# Patient Record
Sex: Female | Born: 1975 | Race: White | Hispanic: No | Marital: Married | Smoking: Never smoker
Health system: Southern US, Community
[De-identification: ages and names within clinical notes are randomized; demographics above are authoritative.]

## PROBLEM LIST (undated history)

## (undated) DIAGNOSIS — G4730 Sleep apnea, unspecified: Secondary | ICD-10-CM

## (undated) DIAGNOSIS — R609 Edema, unspecified: Secondary | ICD-10-CM

## (undated) DIAGNOSIS — I10 Essential (primary) hypertension: Secondary | ICD-10-CM

## (undated) DIAGNOSIS — D869 Sarcoidosis, unspecified: Secondary | ICD-10-CM

## (undated) DIAGNOSIS — J45909 Unspecified asthma, uncomplicated: Secondary | ICD-10-CM

## (undated) DIAGNOSIS — M7989 Other specified soft tissue disorders: Secondary | ICD-10-CM

## (undated) DIAGNOSIS — Z87442 Personal history of urinary calculi: Secondary | ICD-10-CM

## (undated) DIAGNOSIS — M797 Fibromyalgia: Secondary | ICD-10-CM

## (undated) DIAGNOSIS — N946 Dysmenorrhea, unspecified: Secondary | ICD-10-CM

## (undated) DIAGNOSIS — E559 Vitamin D deficiency, unspecified: Secondary | ICD-10-CM

## (undated) DIAGNOSIS — N83209 Unspecified ovarian cyst, unspecified side: Secondary | ICD-10-CM

## (undated) DIAGNOSIS — R413 Other amnesia: Secondary | ICD-10-CM

## (undated) DIAGNOSIS — R599 Enlarged lymph nodes, unspecified: Secondary | ICD-10-CM

## (undated) DIAGNOSIS — M5136 Other intervertebral disc degeneration, lumbar region: Secondary | ICD-10-CM

## (undated) DIAGNOSIS — G4733 Obstructive sleep apnea (adult) (pediatric): Secondary | ICD-10-CM

## (undated) DIAGNOSIS — S82891A Other fracture of right lower leg, initial encounter for closed fracture: Secondary | ICD-10-CM

## (undated) DIAGNOSIS — G479 Sleep disorder, unspecified: Secondary | ICD-10-CM

## (undated) DIAGNOSIS — J309 Allergic rhinitis, unspecified: Secondary | ICD-10-CM

## (undated) DIAGNOSIS — S2222XA Fracture of body of sternum, initial encounter for closed fracture: Secondary | ICD-10-CM

## (undated) DIAGNOSIS — H6090 Unspecified otitis externa, unspecified ear: Secondary | ICD-10-CM

## (undated) DIAGNOSIS — Z9989 Dependence on other enabling machines and devices: Secondary | ICD-10-CM

## (undated) DIAGNOSIS — R945 Abnormal results of liver function studies: Secondary | ICD-10-CM

## (undated) DIAGNOSIS — R5383 Other fatigue: Secondary | ICD-10-CM

## (undated) DIAGNOSIS — F988 Other specified behavioral and emotional disorders with onset usually occurring in childhood and adolescence: Secondary | ICD-10-CM

## (undated) DIAGNOSIS — L309 Dermatitis, unspecified: Secondary | ICD-10-CM

## (undated) HISTORY — DX: Other specified behavioral and emotional disorders with onset usually occurring in childhood and adolescence: F98.8

## (undated) HISTORY — DX: Other amnesia: R41.3

## (undated) HISTORY — DX: Unspecified ovarian cyst, unspecified side: N83.209

## (undated) HISTORY — DX: Dependence on other enabling machines and devices: Z99.89

## (undated) HISTORY — DX: Other intervertebral disc degeneration, lumbar region: M51.36

## (undated) HISTORY — DX: Obstructive sleep apnea (adult) (pediatric): G47.33

## (undated) HISTORY — DX: Abnormal results of liver function studies: R94.5

## (undated) HISTORY — DX: Enlarged lymph nodes, unspecified: R59.9

## (undated) HISTORY — DX: Sarcoidosis, unspecified: D86.9

## (undated) HISTORY — DX: Dysmenorrhea, unspecified: N94.6

## (undated) HISTORY — DX: Sleep disorder, unspecified: G47.9

## (undated) HISTORY — DX: Allergic rhinitis, unspecified: J30.9

## (undated) HISTORY — DX: Vitamin D deficiency, unspecified: E55.9

## (undated) HISTORY — PX: UPPER GI ENDOSCOPY: SHX6162

---
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
## 2016-04-09 HISTORY — PX: OTHER SURGICAL HISTORY: SHX169

## 2018-08-08 DIAGNOSIS — S82891A Other fracture of right lower leg, initial encounter for closed fracture: Secondary | ICD-10-CM

## 2018-08-08 DIAGNOSIS — S2222XA Fracture of body of sternum, initial encounter for closed fracture: Secondary | ICD-10-CM

## 2018-08-08 DIAGNOSIS — L309 Dermatitis, unspecified: Secondary | ICD-10-CM

## 2018-08-08 HISTORY — DX: Other fracture of right lower leg, initial encounter for closed fracture: S82.891A

## 2018-08-08 HISTORY — DX: Fracture of body of sternum, initial encounter for closed fracture: S22.22XA

## 2018-08-08 HISTORY — DX: Dermatitis, unspecified: L30.9

## 2018-09-01 ENCOUNTER — Emergency Department (HOSPITAL_COMMUNITY): Payer: BC Managed Care – PPO

## 2018-09-01 ENCOUNTER — Emergency Department (HOSPITAL_COMMUNITY)
Admission: EM | Admit: 2018-09-01 | Discharge: 2018-09-02 | Disposition: A | Discharge status: Home / Self Care | Payer: BC Managed Care – PPO | Attending: Emergency Medicine | Admitting: Emergency Medicine

## 2018-09-01 ENCOUNTER — Encounter (HOSPITAL_COMMUNITY): Payer: Self-pay

## 2018-09-01 ENCOUNTER — Other Ambulatory Visit: Payer: Self-pay

## 2018-09-01 DIAGNOSIS — R51 Headache: Secondary | ICD-10-CM | POA: Insufficient documentation

## 2018-09-01 DIAGNOSIS — Y939 Activity, unspecified: Secondary | ICD-10-CM | POA: Diagnosis not present

## 2018-09-01 DIAGNOSIS — S2221XA Fracture of manubrium, initial encounter for closed fracture: Secondary | ICD-10-CM | POA: Diagnosis not present

## 2018-09-01 DIAGNOSIS — Y92410 Unspecified street and highway as the place of occurrence of the external cause: Secondary | ICD-10-CM | POA: Insufficient documentation

## 2018-09-01 DIAGNOSIS — S301XXA Contusion of abdominal wall, initial encounter: Secondary | ICD-10-CM | POA: Diagnosis not present

## 2018-09-01 DIAGNOSIS — S20219A Contusion of unspecified front wall of thorax, initial encounter: Secondary | ICD-10-CM | POA: Diagnosis not present

## 2018-09-01 DIAGNOSIS — S93334A Other dislocation of right foot, initial encounter: Secondary | ICD-10-CM | POA: Insufficient documentation

## 2018-09-01 LAB — CBC
HCT: 42.7 % (ref 36.0–46.0)
Hemoglobin: 14.1 g/dL (ref 12.0–15.0)
MCH: 31 pg (ref 26.0–34.0)
MCHC: 33 g/dL (ref 30.0–36.0)
MCV: 93.8 fL (ref 80.0–100.0)
Platelets: 255 10*3/uL (ref 150–400)
RBC: 4.55 MIL/uL (ref 3.87–5.11)
RDW: 12.6 % (ref 11.5–15.5)
WBC: 23.4 10*3/uL — ABNORMAL HIGH (ref 4.0–10.5)
nRBC: 0 % (ref 0.0–0.2)

## 2018-09-01 LAB — COMPREHENSIVE METABOLIC PANEL
ALT: 41 U/L (ref 0–44)
AST: 61 U/L — ABNORMAL HIGH (ref 15–41)
Albumin: 3.9 g/dL (ref 3.5–5.0)
Alkaline Phosphatase: 58 U/L (ref 38–126)
Anion gap: 11 (ref 5–15)
BUN: 17 mg/dL (ref 6–20)
CO2: 27 mmol/L (ref 22–32)
Calcium: 9.4 mg/dL (ref 8.9–10.3)
Chloride: 98 mmol/L (ref 98–111)
Creatinine, Ser: 0.93 mg/dL (ref 0.44–1.00)
GFR calc Af Amer: >60 mL/min (ref >60)
GFR calc non Af Amer: >60 mL/min (ref >60)
Glucose, Bld: 146 mg/dL — ABNORMAL HIGH (ref 70–99)
Potassium: 3.4 mmol/L — ABNORMAL LOW (ref 3.5–5.1)
Sodium: 136 mmol/L (ref 135–145)
Total Bilirubin: 0.6 mg/dL (ref 0.3–1.2)
Total Protein: 7.1 g/dL (ref 6.5–8.1)

## 2018-09-01 LAB — I-STAT BETA HCG BLOOD, ED (MC, WL, AP ONLY): I-stat hCG, quantitative: <5 m[IU]/mL (ref <5)

## 2018-09-01 LAB — PROTIME-INR
INR: 1.1 (ref 0.8–1.2)
Prothrombin Time: 13.9 seconds (ref 11.4–15.2)

## 2018-09-01 MED ORDER — HYDROMORPHONE HCL 1 MG/ML IJ SOLN
1.0000 mg | Freq: Once | INTRAMUSCULAR | Status: AC
  Administered 2018-09-01: 1 mg via INTRAVENOUS
  Filled 2018-09-01: qty 1

## 2018-09-01 MED ORDER — FENTANYL CITRATE (PF) 100 MCG/2ML IJ SOLN: 50.0000 ug | Freq: Once | INTRAMUSCULAR | Status: DC

## 2018-09-01 MED ORDER — IOHEXOL 300 MG/ML  SOLN
100.0000 mL | Freq: Once | INTRAMUSCULAR | Status: AC | PRN
  Administered 2018-09-01: 100 mL via INTRAVENOUS

## 2018-09-01 MED ORDER — LIDOCAINE HCL (PF) 1 % IJ SOLN
10.0000 mL | Freq: Once | INTRAMUSCULAR | Status: AC
  Administered 2018-09-02: 10 mL
  Filled 2018-09-01: qty 10

## 2018-09-01 MED ORDER — ONDANSETRON HCL 4 MG/2ML IJ SOLN
4.0000 mg | Freq: Once | INTRAMUSCULAR | Status: AC
  Administered 2018-09-01: 4 mg via INTRAVENOUS
  Filled 2018-09-01: qty 2

## 2018-09-01 MED ORDER — FENTANYL CITRATE (PF) 100 MCG/2ML IJ SOLN
50.0000 ug | Freq: Once | INTRAMUSCULAR | Status: AC
  Administered 2018-09-01: 50 ug via INTRAVENOUS
  Filled 2018-09-01: qty 2

## 2018-09-01 MED ORDER — HYDROMORPHONE HCL 1 MG/ML IJ SOLN
1.0000 mg | Freq: Once | INTRAMUSCULAR | Status: AC
  Administered 2018-09-01: 23:00:00 1 mg via INTRAVENOUS
  Filled 2018-09-01: qty 1
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
## 2018-09-01 NOTE — ED Notes (Signed)
PT transported to imaging  
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
## 2018-09-02 ENCOUNTER — Emergency Department (HOSPITAL_COMMUNITY): Payer: BC Managed Care – PPO

## 2018-09-02 DIAGNOSIS — S2221XA Fracture of manubrium, initial encounter for closed fracture: Secondary | ICD-10-CM | POA: Diagnosis not present

## 2018-09-02 MED ORDER — HYDROCODONE-ACETAMINOPHEN 5-325 MG PO TABS: 1.0000 | ORAL_TABLET | ORAL | 0 refills | Status: DC | PRN
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
## 2018-09-02 NOTE — ED Provider Notes (Signed)
Reduction of dislocation Date/Time: 09/02/2018 12:24 AM Performed by: Dicky Doe, MD Authorized by: Glynn Octave, MD  Consent: Verbal consent obtained. Consent given by: patient Patient consent: the patient's understanding of the procedure matches consent given Patient identity confirmed: verbally with patient and arm band Local anesthesia used: yes Anesthesia: local infiltration and digital block  Anesthesia: Local anesthesia used: yes Local Anesthetic: lidocaine 1% without epinephrine Anesthetic total: 4 mL  Sedation: Patient sedated: no  Patient tolerance: Patient tolerated the procedure well with no immediate complications       Dicky Doe, MD 09/02/18 Waunita Schooner    Glynn Octave, MD 09/02/18 [phone]

## 2018-09-02 NOTE — Discharge Instructions (Addendum)
Your CT scans are negative for any significant injury but show a good amount of bruising and a crack in your breastbone.  Will be very sore all over for the next several days.  You should not put weight on your right foot and follow-up with Dr. Eulah Pont regarding her dislocation.  Take the pain medication as prescribed.  Follow-up with your primary doctor regarding the inflamed lymph nodes seen on your CT scans.  Return to the ED develop chest pain, shortness of breath or other concerns.

## 2018-09-08 ENCOUNTER — Other Ambulatory Visit: Payer: Self-pay | Admitting: Orthopedic Surgery

## 2018-09-08 DIAGNOSIS — M79672 Pain in left foot: Secondary | ICD-10-CM

## 2018-09-11 ENCOUNTER — Ambulatory Visit
Admission: RE | Admit: 2018-09-11 | Discharge: 2018-09-11 | Disposition: A | Discharge status: Home / Self Care | Payer: BC Managed Care – PPO | Source: Ambulatory Visit | Attending: Orthopedic Surgery | Admitting: Orthopedic Surgery

## 2018-09-11 ENCOUNTER — Other Ambulatory Visit: Payer: Self-pay | Admitting: Orthopedic Surgery

## 2018-09-11 ENCOUNTER — Other Ambulatory Visit: Payer: Self-pay

## 2018-09-11 DIAGNOSIS — M79671 Pain in right foot: Secondary | ICD-10-CM

## 2018-09-15 ENCOUNTER — Other Ambulatory Visit (HOSPITAL_COMMUNITY)
Admission: RE | Admit: 2018-09-15 | Discharge: 2018-09-15 | Disposition: A | Discharge status: Home / Self Care | Payer: BC Managed Care – PPO | Source: Ambulatory Visit | Attending: Orthopaedic Surgery | Admitting: Orthopaedic Surgery

## 2018-09-15 ENCOUNTER — Other Ambulatory Visit: Payer: Self-pay | Admitting: Orthopaedic Surgery

## 2018-09-15 ENCOUNTER — Encounter (HOSPITAL_BASED_OUTPATIENT_CLINIC_OR_DEPARTMENT_OTHER): Payer: Self-pay | Admitting: *Deleted

## 2018-09-15 ENCOUNTER — Other Ambulatory Visit: Payer: Self-pay

## 2018-09-15 DIAGNOSIS — Z1159 Encounter for screening for other viral diseases: Secondary | ICD-10-CM | POA: Diagnosis present

## 2018-09-15 LAB — SARS CORONAVIRUS 2 BY RT PCR (HOSPITAL ORDER, PERFORMED IN ~~LOC~~ HOSPITAL LAB): SARS Coronavirus 2: NEGATIVE

## 2018-09-16 ENCOUNTER — Ambulatory Visit (HOSPITAL_BASED_OUTPATIENT_CLINIC_OR_DEPARTMENT_OTHER): Payer: BC Managed Care – PPO | Admitting: Certified Registered"

## 2018-09-16 ENCOUNTER — Ambulatory Visit (HOSPITAL_COMMUNITY): Payer: BC Managed Care – PPO

## 2018-09-16 ENCOUNTER — Encounter (HOSPITAL_BASED_OUTPATIENT_CLINIC_OR_DEPARTMENT_OTHER)
Admission: RE | Disposition: A | Discharge status: Home / Self Care | Payer: Self-pay | Source: Home / Self Care | Attending: Orthopaedic Surgery

## 2018-09-16 ENCOUNTER — Ambulatory Visit (HOSPITAL_BASED_OUTPATIENT_CLINIC_OR_DEPARTMENT_OTHER)
Admission: RE | Admit: 2018-09-16 | Discharge: 2018-09-16 | Disposition: A | Discharge status: Home / Self Care | Payer: BC Managed Care – PPO | Attending: Orthopaedic Surgery | Admitting: Orthopaedic Surgery

## 2018-09-16 ENCOUNTER — Encounter (HOSPITAL_BASED_OUTPATIENT_CLINIC_OR_DEPARTMENT_OTHER): Payer: Self-pay | Admitting: Certified Registered"

## 2018-09-16 DIAGNOSIS — Z419 Encounter for procedure for purposes other than remedying health state, unspecified: Secondary | ICD-10-CM

## 2018-09-16 DIAGNOSIS — S93324A Dislocation of tarsometatarsal joint of right foot, initial encounter: Secondary | ICD-10-CM | POA: Diagnosis not present

## 2018-09-16 DIAGNOSIS — S92221A Displaced fracture of lateral cuneiform of right foot, initial encounter for closed fracture: Secondary | ICD-10-CM | POA: Diagnosis not present

## 2018-09-16 DIAGNOSIS — S92211A Displaced fracture of cuboid bone of right foot, initial encounter for closed fracture: Secondary | ICD-10-CM | POA: Insufficient documentation

## 2018-09-16 DIAGNOSIS — S9421XA Injury of deep peroneal nerve at ankle and foot level, right leg, initial encounter: Secondary | ICD-10-CM | POA: Diagnosis not present

## 2018-09-16 DIAGNOSIS — S93311A Subluxation of tarsal joint of right foot, initial encounter: Secondary | ICD-10-CM | POA: Insufficient documentation

## 2018-09-16 DIAGNOSIS — S92341A Displaced fracture of fourth metatarsal bone, right foot, initial encounter for closed fracture: Secondary | ICD-10-CM | POA: Diagnosis not present

## 2018-09-16 DIAGNOSIS — S95091A Other specified injury of dorsal artery of right foot, initial encounter: Secondary | ICD-10-CM | POA: Insufficient documentation

## 2018-09-16 DIAGNOSIS — S92311A Displaced fracture of first metatarsal bone, right foot, initial encounter for closed fracture: Secondary | ICD-10-CM | POA: Insufficient documentation

## 2018-09-16 DIAGNOSIS — J45909 Unspecified asthma, uncomplicated: Secondary | ICD-10-CM | POA: Insufficient documentation

## 2018-09-16 DIAGNOSIS — Z79899 Other long term (current) drug therapy: Secondary | ICD-10-CM | POA: Diagnosis not present

## 2018-09-16 DIAGNOSIS — Z6841 Body Mass Index (BMI) 40.0 and over, adult: Secondary | ICD-10-CM | POA: Diagnosis not present

## 2018-09-16 DIAGNOSIS — S92241A Displaced fracture of medial cuneiform of right foot, initial encounter for closed fracture: Secondary | ICD-10-CM | POA: Insufficient documentation

## 2018-09-16 DIAGNOSIS — G4730 Sleep apnea, unspecified: Secondary | ICD-10-CM | POA: Diagnosis not present

## 2018-09-16 DIAGNOSIS — S92321A Displaced fracture of second metatarsal bone, right foot, initial encounter for closed fracture: Secondary | ICD-10-CM | POA: Insufficient documentation

## 2018-09-16 HISTORY — DX: Sleep apnea, unspecified: G47.30

## 2018-09-16 HISTORY — DX: Personal history of urinary calculi: Z87.442

## 2018-09-16 HISTORY — DX: Unspecified asthma, uncomplicated: J45.909

## 2018-09-16 HISTORY — PX: OPEN REDUCTION INTERNAL FIXATION (ORIF) FOOT LISFRANC FRACTURE: SHX5990

## 2018-09-16 SURGERY — OPEN REDUCTION INTERNAL FIXATION (ORIF) FOOT LISFRANC FRACTURE
Anesthesia: General | Site: Foot | Laterality: Right

## 2018-09-16 MED ORDER — ROPIVACAINE HCL 5 MG/ML IJ SOLN
INTRAMUSCULAR | Status: DC | PRN
  Administered 2018-09-16: 20 mL via PERINEURAL
  Administered 2018-09-16: 30 mL via PERINEURAL

## 2018-09-16 MED ORDER — OXYCODONE HCL 5 MG PO TABS
5.0000 mg | ORAL_TABLET | Freq: Once | ORAL | Status: AC | PRN
  Administered 2018-09-16: 5 mg via ORAL

## 2018-09-16 MED ORDER — FENTANYL CITRATE (PF) 250 MCG/5ML IJ SOLN
INTRAMUSCULAR | Status: DC | PRN
  Administered 2018-09-16: 50 ug via INTRAVENOUS
  Administered 2018-09-16 (×2): 25 ug via INTRAVENOUS

## 2018-09-16 MED ORDER — KETOROLAC TROMETHAMINE 0.5 % OP SOLN
OPHTHALMIC | Status: AC
  Filled 2018-09-16: qty 5

## 2018-09-16 MED ORDER — MIDAZOLAM HCL 2 MG/2ML IJ SOLN
1.0000 mg | INTRAMUSCULAR | Status: DC | PRN
  Administered 2018-09-16: 2 mg via INTRAVENOUS

## 2018-09-16 MED ORDER — PROPOFOL 10 MG/ML IV BOLUS
INTRAVENOUS | Status: AC
  Filled 2018-09-16: qty 20

## 2018-09-16 MED ORDER — LIDOCAINE 2% (20 MG/ML) 5 ML SYRINGE
INTRAMUSCULAR | Status: DC | PRN
  Administered 2018-09-16: 100 mg via INTRAVENOUS

## 2018-09-16 MED ORDER — SCOPOLAMINE 1 MG/3DAYS TD PT72: 1.0000 | MEDICATED_PATCH | Freq: Once | TRANSDERMAL | Status: DC | PRN

## 2018-09-16 MED ORDER — PROPOFOL 10 MG/ML IV BOLUS
INTRAVENOUS | Status: DC | PRN
  Administered 2018-09-16: 200 mg via INTRAVENOUS

## 2018-09-16 MED ORDER — FENTANYL CITRATE (PF) 100 MCG/2ML IJ SOLN
INTRAMUSCULAR | Status: AC
  Filled 2018-09-16: qty 2

## 2018-09-16 MED ORDER — BSS IO SOLN: 15.0000 mL | Freq: Once | INTRAOCULAR | Status: DC

## 2018-09-16 MED ORDER — MIDAZOLAM HCL 2 MG/2ML IJ SOLN
INTRAMUSCULAR | Status: AC
  Filled 2018-09-16: qty 2

## 2018-09-16 MED ORDER — LACTATED RINGERS IV SOLN
INTRAVENOUS | Status: DC
  Administered 2018-09-16 (×2): via INTRAVENOUS

## 2018-09-16 MED ORDER — POVIDONE-IODINE 10 % EX SWAB: 2.0000 "application " | Freq: Once | CUTANEOUS | Status: DC

## 2018-09-16 MED ORDER — SUCCINYLCHOLINE CHLORIDE 200 MG/10ML IV SOSY
PREFILLED_SYRINGE | INTRAVENOUS | Status: DC | PRN
  Administered 2018-09-16: 120 mg via INTRAVENOUS

## 2018-09-16 MED ORDER — ONDANSETRON HCL 4 MG/2ML IJ SOLN
INTRAMUSCULAR | Status: AC
  Filled 2018-09-16: qty 4

## 2018-09-16 MED ORDER — OXYCODONE HCL 5 MG PO TABS: 5.0000 mg | ORAL_TABLET | Freq: Four times a day (QID) | ORAL | 0 refills | Status: AC | PRN

## 2018-09-16 MED ORDER — ONDANSETRON HCL 4 MG/2ML IJ SOLN
INTRAMUSCULAR | Status: DC | PRN
  Administered 2018-09-16: 4 mg via INTRAVENOUS

## 2018-09-16 MED ORDER — ROCURONIUM BROMIDE 10 MG/ML (PF) SYRINGE
PREFILLED_SYRINGE | INTRAVENOUS | Status: DC | PRN
  Administered 2018-09-16: 50 mg via INTRAVENOUS

## 2018-09-16 MED ORDER — KETOROLAC TROMETHAMINE 0.5 % OP SOLN: 1.0000 [drp] | Freq: Three times a day (TID) | OPHTHALMIC | Status: DC | PRN

## 2018-09-16 MED ORDER — OXYCODONE HCL 5 MG PO TABS
ORAL_TABLET | ORAL | Status: AC
  Filled 2018-09-16: qty 1

## 2018-09-16 MED ORDER — CEFAZOLIN SODIUM-DEXTROSE 2-4 GM/100ML-% IV SOLN
INTRAVENOUS | Status: AC
  Filled 2018-09-16: qty 100

## 2018-09-16 MED ORDER — OXYCODONE HCL 5 MG/5ML PO SOLN: 5.0000 mg | Freq: Once | ORAL | Status: AC | PRN

## 2018-09-16 MED ORDER — FENTANYL CITRATE (PF) 100 MCG/2ML IJ SOLN: 25.0000 ug | INTRAMUSCULAR | Status: DC | PRN

## 2018-09-16 MED ORDER — FENTANYL CITRATE (PF) 100 MCG/2ML IJ SOLN
50.0000 ug | INTRAMUSCULAR | Status: DC | PRN
  Administered 2018-09-16: 100 ug via INTRAVENOUS

## 2018-09-16 MED ORDER — ARTIFICIAL TEARS OPHTHALMIC OINT
TOPICAL_OINTMENT | OPHTHALMIC | Status: AC
  Filled 2018-09-16: qty 3.5

## 2018-09-16 MED ORDER — CEFAZOLIN SODIUM-DEXTROSE 2-4 GM/100ML-% IV SOLN
2.0000 g | INTRAVENOUS | Status: AC
  Administered 2018-09-16: 2 g via INTRAVENOUS

## 2018-09-16 MED ORDER — DEXAMETHASONE SODIUM PHOSPHATE 10 MG/ML IJ SOLN
INTRAMUSCULAR | Status: DC | PRN
  Administered 2018-09-16: 5 mg via INTRAVENOUS

## 2018-09-16 MED ORDER — ONDANSETRON HCL 4 MG/2ML IJ SOLN: 4.0000 mg | Freq: Once | INTRAMUSCULAR | Status: DC | PRN

## 2018-09-16 MED ORDER — CLONIDINE HCL (ANALGESIA) 100 MCG/ML EP SOLN
EPIDURAL | Status: DC | PRN
  Administered 2018-09-16: 100 ug

## 2018-09-16 SURGICAL SUPPLY — 81 items
BANDAGE ACE 4X5 VEL STRL LF (GAUZE/BANDAGES/DRESSINGS) IMPLANT
BANDAGE ACE 6X5 VEL STRL LF (GAUZE/BANDAGES/DRESSINGS) ×4 IMPLANT
BANDAGE ESMARK 6X9 LF (GAUZE/BANDAGES/DRESSINGS) IMPLANT
BENZOIN TINCTURE PRP APPL 2/3 (GAUZE/BANDAGES/DRESSINGS) IMPLANT
BIT DRILL 2.5 CANN ARTHO (BIT) ×2 IMPLANT
BIT DRILL 3.2 CANN STRGHT (BIT) ×2 IMPLANT
BLADE SURG 15 STRL LF DISP TIS (BLADE) ×4 IMPLANT
BLADE SURG 15 STRL SS (BLADE) ×4
BNDG COHESIVE 4X5 TAN STRL (GAUZE/BANDAGES/DRESSINGS) ×2 IMPLANT
BNDG ESMARK 4X9 LF (GAUZE/BANDAGES/DRESSINGS) IMPLANT
BNDG ESMARK 6X9 LF (GAUZE/BANDAGES/DRESSINGS)
CHLORAPREP W/TINT 26 (MISCELLANEOUS) ×2 IMPLANT
COVER BACK TABLE REUSABLE LG (DRAPES) ×4 IMPLANT
COVER MAYO STAND STRL (DRAPES) ×2 IMPLANT
COVER WAND RF STERILE (DRAPES) IMPLANT
CUFF TOURN SGL QUICK 34 (TOURNIQUET CUFF)
CUFF TOURN SGL QUICK 42 (TOURNIQUET CUFF) ×2 IMPLANT
CUFF TRNQT CYL 34X4.125X (TOURNIQUET CUFF) IMPLANT
DECANTER SPIKE VIAL GLASS SM (MISCELLANEOUS) IMPLANT
DRAPE C-ARM 42X72 X-RAY (DRAPES) ×2 IMPLANT
DRAPE C-ARMOR (DRAPES) ×2 IMPLANT
DRAPE EXTREMITY T 121X128X90 (DISPOSABLE) ×2 IMPLANT
DRAPE IMP U-DRAPE 54X76 (DRAPES) ×2 IMPLANT
DRAPE OEC MINIVIEW 54X84 (DRAPES) IMPLANT
DRAPE U-SHAPE 47X51 STRL (DRAPES) ×2 IMPLANT
ELECT REM PT RETURN 9FT ADLT (ELECTROSURGICAL) ×2
ELECTRODE REM PT RTRN 9FT ADLT (ELECTROSURGICAL) ×1 IMPLANT
GAUZE SPONGE 4X4 12PLY STRL (GAUZE/BANDAGES/DRESSINGS) ×2 IMPLANT
GAUZE XEROFORM 1X8 LF (GAUZE/BANDAGES/DRESSINGS) ×2 IMPLANT
GLOVE BIO SURGEON STRL SZ7.5 (GLOVE) IMPLANT
GLOVE BIOGEL PI IND STRL 7.0 (GLOVE) ×4 IMPLANT
GLOVE BIOGEL PI IND STRL 8 (GLOVE) ×1 IMPLANT
GLOVE BIOGEL PI INDICATOR 7.0 (GLOVE) ×4
GLOVE BIOGEL PI INDICATOR 8 (GLOVE) ×1
GLOVE SURG SS PI 7.0 STRL IVOR (GLOVE) ×6 IMPLANT
GLOVE SURG SYN 7.5  E (GLOVE) ×3
GLOVE SURG SYN 7.5 E (GLOVE) ×3 IMPLANT
GOWN STRL REUS W/ TWL LRG LVL3 (GOWN DISPOSABLE) ×2 IMPLANT
GOWN STRL REUS W/ TWL XL LVL3 (GOWN DISPOSABLE) ×2 IMPLANT
GOWN STRL REUS W/TWL LRG LVL3 (GOWN DISPOSABLE) ×2
GOWN STRL REUS W/TWL XL LVL3 (GOWN DISPOSABLE) ×2
GUIDEWIRE .045XTROC TIP LSR LN (WIRE) ×2 IMPLANT
GUIDEWIRE 1.6 (WIRE) ×4
GUIDEWIRE ORTH 157X1.6XTROC (WIRE) ×4 IMPLANT
K-WIRE 1.1 (WIRE) ×2
K-WIRE BB-TAK (WIRE) ×2
KWIRE BB-TAK (WIRE) ×1 IMPLANT
NS IRRIG 1000ML POUR BTL (IV SOLUTION) ×2 IMPLANT
PACK BASIN DAY SURGERY FS (CUSTOM PROCEDURE TRAY) ×2 IMPLANT
PAD CAST 4YDX4 CTTN HI CHSV (CAST SUPPLIES) ×1 IMPLANT
PADDING CAST COTTON 4X4 STRL (CAST SUPPLIES) ×1
PADDING CAST SYNTHETIC 4 (CAST SUPPLIES) ×4
PADDING CAST SYNTHETIC 4X4 STR (CAST SUPPLIES) ×4 IMPLANT
PENCIL BUTTON HOLSTER BLD 10FT (ELECTRODE) ×2 IMPLANT
PLATE DIAMOND M (Plate) ×2 IMPLANT
PLATE H SMALL (Plate) ×2 IMPLANT
SCREW 4.0X28 (Screw) ×2 IMPLANT
SCREW COMP 4.0X30 (Screw) ×2 IMPLANT
SCREW CORT FT 3.5X28 (Screw) ×2 IMPLANT
SCREW CORT TI FT ANKLE 3.5X20 (Screw) ×2 IMPLANT
SCREW LOCK FT SD 3.5X28 (Screw) ×2 IMPLANT
SCREW LP TIT 3.5X30 (Screw) ×8 IMPLANT
SCREW LP TITANIUM 3.5X24MM (Screw) ×2 IMPLANT
SLEEVE SCD COMPRESS KNEE MED (MISCELLANEOUS) ×2 IMPLANT
SPLINT FAST PLASTER 5X30 (CAST SUPPLIES) ×20
SPLINT PLASTER CAST FAST 5X30 (CAST SUPPLIES) ×20 IMPLANT
SPONGE LAP 18X18 RF (DISPOSABLE) ×4 IMPLANT
STAPLER VISISTAT 35W (STAPLE) IMPLANT
STOCKINETTE 6  STRL (DRAPES) ×1
STOCKINETTE 6 STRL (DRAPES) ×1 IMPLANT
STRIP CLOSURE SKIN 1/2X4 (GAUZE/BANDAGES/DRESSINGS) IMPLANT
SUCTION FRAZIER HANDLE 10FR (MISCELLANEOUS) ×1
SUCTION TUBE FRAZIER 10FR DISP (MISCELLANEOUS) ×1 IMPLANT
SUT ETHILON 3 0 PS 1 (SUTURE) ×2 IMPLANT
SUT MNCRL AB 3-0 PS2 18 (SUTURE) ×2 IMPLANT
SUT PDS AB 2-0 CT2 27 (SUTURE) ×4 IMPLANT
SUT VIC AB 3-0 FS2 27 (SUTURE) IMPLANT
SYR BULB 3OZ (MISCELLANEOUS) ×2 IMPLANT
TOWEL GREEN STERILE FF (TOWEL DISPOSABLE) ×4 IMPLANT
TUBE CONNECTING 20X1/4 (TUBING) ×2 IMPLANT
UNDERPAD 30X30 (UNDERPADS AND DIAPERS) ×2 IMPLANT

## 2018-09-16 NOTE — Anesthesia Preprocedure Evaluation (Addendum)
Anesthesia Evaluation  Patient identified by MRN, date of birth, ID band Patient awake    Reviewed: Allergy & Precautions, NPO status , Patient's Chart, lab work & pertinent test results  History of Anesthesia Complications Negative for: history of anesthetic complications  Airway Mallampati: II  TM Distance: >3 FB Neck ROM: Full    Dental no notable dental hx.    Pulmonary asthma , sleep apnea ,    Pulmonary exam normal        Cardiovascular negative cardio ROS Normal cardiovascular exam     Neuro/Psych negative neurological ROS  negative psych ROS   GI/Hepatic Neg liver ROS, GERD  ,  Endo/Other  Morbid obesity  Renal/GU negative Renal ROS  negative genitourinary   Musculoskeletal negative musculoskeletal ROS (+)   Abdominal   Peds  Hematology negative hematology ROS (+)   Anesthesia Other Findings   Reproductive/Obstetrics                            Anesthesia Physical Anesthesia Plan  ASA: III  Anesthesia Plan: General   Post-op Pain Management: GA combined w/ Regional for post-op pain   Induction: Intravenous and Rapid sequence  PONV Risk Score and Plan: 3 and Ondansetron, Dexamethasone, Midazolam and Treatment may vary due to age or medical condition  Airway Management Planned: Oral ETT  Additional Equipment: None  Intra-op Plan:   Post-operative Plan: Extubation in OR  Informed Consent: I have reviewed the patients History and Physical, chart, labs and discussed the procedure including the risks, benefits and alternatives for the proposed anesthesia with the patient or authorized representative who has indicated his/her understanding and acceptance.     Dental advisory given  Plan Discussed with:   Anesthesia Plan Comments:        Anesthesia Quick Evaluation
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
## 2018-09-16 NOTE — Anesthesia Procedure Notes (Signed)
Anesthesia Regional Block: Adductor canal block   Pre-Anesthetic Checklist: ,, timeout performed, Correct Patient, Correct Site, Correct Laterality, Correct Procedure, Correct Position, site marked, Risks and benefits discussed,  Surgical consent,  Pre-op evaluation,  At surgeon's request and post-op pain management  Laterality: Right  Prep: chloraprep       Needles:  Injection technique: Single-shot  Needle Type: Echogenic Stimulator Needle     Needle Length: 9cm  Needle Gauge: 21     Additional Needles:   Procedures:,,,, ultrasound used (permanent image in chart),,,,  Narrative:  Start time: 09/16/2018 9:45 AM End time: 09/16/2018 9:49 AM Injection made incrementally with aspirations every 5 mL.  Performed by: Personally  Anesthesiologist: Lidia Collum, MD  Additional Notes: Monitors applied. Injection made in 5cc increments. No resistance to injection. Good needle visualization. Patient tolerated procedure well.

## 2018-09-16 NOTE — Anesthesia Postprocedure Evaluation (Signed)
Anesthesia Post Note  Patient: Cilicia Borden  Procedure(s) Performed: OPEN TREATMENT RIGHT FOOT FRACTURE DISLOCATION (Right Foot)     Patient location during evaluation: PACU Anesthesia Type: General Level of consciousness: awake and alert and oriented Pain management: pain level controlled Vital Signs Assessment: post-procedure vital signs reviewed and stable Respiratory status: spontaneous breathing, nonlabored ventilation and respiratory function stable Cardiovascular status: blood pressure returned to baseline and stable Postop Assessment: no apparent nausea or vomiting Anesthetic complications: yes Anesthetic complication details: injury of corneaComments: Patient has FB sensation OD. I cannot see an obvious corneal abrasion but will treat as corneal abrasion. Protocol instituted. Patient informed. Will send home with eyedrops. RN will follow up tomorrow.    Last Vitals:  Vitals:   09/16/18 1445 09/16/18 1511  BP:  123/69  Pulse: 91 93  Resp: 20 20  Temp:  37 C  SpO2: 97% 96%    Last Pain:  Vitals:   09/16/18 1511  TempSrc:   PainSc: 0-No pain        RLE Motor Response: No movement due to regional block (09/16/18 1511) RLE Sensation: No sensation (absent) (09/16/18 1511)      Davelle Anselmi A.
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
## 2018-09-16 NOTE — Progress Notes (Signed)
Assisted Dr. Witman with right, ultrasound guided, popliteal/saphenous block. Side rails up, monitors on throughout procedure. See vital signs in flow sheet. Tolerated Procedure well. 

## 2018-09-16 NOTE — Discharge Instructions (Signed)
Use eye drops provided:  1 drop affected eye(s) every 8 hours for 24 hours.  Call anesthesia department @ [phone] if not significantly improved in 24 hours.  Corneal Abrasion    The cornea is the clear covering at the front and center of the eye. When looking at the colored portion of the eye (iris), you are looking through the cornea. This very thin tissue is made up of many layers. The surface layer is a single layer of cells (corneal epithelium) and is one of the most sensitive tissues in the body. If a scratch or injury causes the corneal epithelium to come off, it is called a corneal abrasion. If the injury extends to the tissues below the epithelium, the condition is called a corneal ulcer.  CAUSES  Scratches.  Trauma.  Foreign body in the eye. Some people have recurrences of abrasions in the area of the original injury even after it has healed (recurrent erosion syndrome). Recurrent erosion syndrome generally improves and goes away with time.  SYMPTOMS  Eye pain.  Difficulty or inability to keep the injured eye open.  The eye becomes very sensitive to light.  Recurrent erosions tend to happen suddenly, first thing in the morning, usually after waking up and opening the eye. DIAGNOSIS  Your health care provider can diagnose a corneal abrasion during an eye exam. Dye is usually placed in the eye using a drop or a small paper strip moistened by your tears. When the eye is examined with a special light, the abrasion shows up clearly because of the dye.  TREATMENT  Small abrasions may be treated with antibiotic drops or ointment alone.  A pressure patch may be put over the eye. If this is done, follow your doctor's instructions for when to remove the patch. Do not drive or use machines while the eye patch is on. Judging distances is hard to do with a patch on. If the abrasion becomes infected and spreads to the deeper tissues of the cornea, a corneal ulcer can result. This is serious  because it can cause corneal scarring. Corneal scars interfere with light passing through the cornea and cause a loss of vision in the involved eye.  HOME CARE INSTRUCTIONS  Use medicine or ointment as directed. Only take over-the-counter or prescription medicines for pain, discomfort, or fever as directed by your health care provider.  Do not drive or operate machinery if your eye is patched. Your ability to judge distances is impaired.  If your health care provider has given you a follow-up appointment, it is very important to keep that appointment. Not keeping the appointment could result in a severe eye infection or permanent loss of vision. If there is any problem keeping the appointment, let your health care provider know. SEEK MEDICAL CARE IF:  You have pain, light sensitivity, and a scratchy feeling in one eye or both eyes.  Your pressure patch keeps loosening up, and you can blink your eye under the patch after treatment.  Any kind of discharge develops from the eye after treatment or if the lids stick together in the morning.  You have the same symptoms in the morning as you did with the original abrasion days, weeks, or months after the abrasion healed.   [facility] POST-OP INSTRUCTIONS   Pain Management 1. The numbing medicine and your leg will last around 8 hours, take a dose of your pain medicine as soon as you feel it wearing off to avoid  rebound pain. 2. Keep your foot elevated above heart level.  Make sure that your heel hangs free ('floats'). 3. Take all prescribed medication as directed. 4. If taking narcotic pain medication you may want to use an over-the-counter stool softener to avoid constipation. 5. You may take over-the-counter NSAIDs (ibuprofen, naproxen, etc.) as well as over-the-counter acetaminophen as directed on the packaging as a supplement for your pain and may also use it to wean away from the prescription  medication.  Activity ? Non-weightbearing ? Postoperatively, you will be placed into a splint which stays on for 2 weeks and then will be changed at your first postop visit.  First Postoperative Visit 1. Your first postop visit will be at least 2 weeks after surgery.  This should be scheduled when you schedule surgery. 2. If you do not have a postoperative visit scheduled please call [phone] to schedule an appointment. 3. At the appointment your incision will be evaluated for suture removal, x-rays will be obtained if necessary.  General Instructions 1. Swelling is very common after foot and ankle surgery.  It often takes 3 months for the foot and ankle to begin to feel comfortable.  Some amount of swelling will persist for 6-12 months. 2. DO NOT change the dressing.  If there is a problem with the dressing (too tight, loose, gets wet, etc.) please contact Dr. Donnie MesaAdair's office. 3. DO NOT get the dressing wet.  For showers you can use an over-the-counter cast cover or wrap a washcloth around the top of your dressing and then cover it with a plastic bag and tape it to your leg. 4. DO NOT soak the incision (no tubs, pools, bath, etc.) until you have approval from Dr. Susa SimmondsAdair.  Contact Dr. Garret ReddishAdairs office or go to Emergency Room if: 1. Temperature above 101 F. 2. Increasing pain that is unresponsive to pain medication or elevation 3. Excessive redness or swelling in your foot 4. Dressing problems - excessive bloody drainage, looseness or tightness, or if dressing gets wet 5. Develop pain, swelling, warmth, or discoloration of your calf     Post Anesthesia Home Care Instructions  Activity: Get plenty of rest for the remainder of the day. A responsible individual must stay with you for 24 hours following the procedure.  For the next 24 hours, DO NOT: -Drive a car -Advertising copywriterperate machinery -Drink alcoholic beverages -Take any medication unless instructed by your physician -Make any legal  decisions or sign important papers.  Meals: Start with liquid foods such as gelatin or soup. Progress to regular foods as tolerated. Avoid greasy, spicy, heavy foods. If nausea and/or vomiting occur, drink only clear liquids until the nausea and/or vomiting subsides. Call your physician if vomiting continues.  Special Instructions/Symptoms: Your throat may feel dry or sore from the anesthesia or the breathing tube placed in your throat during surgery. If this causes discomfort, gargle with warm salt water. The discomfort should disappear within 24 hours.  If you had a scopolamine patch placed behind your ear for the management of post- operative nausea and/or vomiting:  1. The medication in the patch is effective for 72 hours, after which it should be removed.  Wrap patch in a tissue and discard in the trash. Wash hands thoroughly with soap and water. 2. You may remove the patch earlier than 72 hours if you experience unpleasant side effects which may include dry mouth, dizziness or visual disturbances. 3. Avoid touching the patch. Wash your hands with soap and water after contact  with the patch.       Regional Anesthesia Blocks  1. Numbness or the inability to move the "blocked" extremity may last from 3-48 hours after placement. The length of time depends on the medication injected and your individual response to the medication. If the numbness is not going away after 48 hours, call your surgeon.  2. The extremity that is blocked will need to be protected until the numbness is gone and the  Strength has returned. Because you cannot feel it, you will need to take extra care to avoid injury. Because it may be weak, you may have difficulty moving it or using it. You may not know what position it is in without looking at it while the block is in effect.  3. For blocks in the legs and feet, returning to weight bearing and walking needs to be done carefully. You will need to wait until the  numbness is entirely gone and the strength has returned. You should be able to move your leg and foot normally before you try and bear weight or walk. You will need someone to be with you when you first try to ensure you do not fall and possibly risk injury.  4. Bruising and tenderness at the needle site are common side effects and will resolve in a few days.  5. Persistent numbness or new problems with movement should be communicated to the surgeon or the [facility] [phone] [place] [phone]).

## 2018-09-16 NOTE — Anesthesia Procedure Notes (Signed)
Procedure Name: Intubation Date/Time: 09/16/2018 10:24 AM Performed by: Myna Bright, CRNA Pre-anesthesia Checklist: Patient identified, Emergency Drugs available, Suction available and Patient being monitored Patient Re-evaluated:Patient Re-evaluated prior to induction Oxygen Delivery Method: Circle system utilized Preoxygenation: Pre-oxygenation with 100% oxygen Induction Type: IV induction, Rapid sequence and Cricoid Pressure applied Laryngoscope Size: Mac and 3 Grade View: Grade II Tube type: Oral Tube size: 7.0 mm Number of attempts: 1 Airway Equipment and Method: Stylet Placement Confirmation: ETT inserted through vocal cords under direct vision,  positive ETCO2 and breath sounds checked- equal and bilateral Secured at: 21 cm Tube secured with: Tape Dental Injury: Teeth and Oropharynx as per pre-operative assessment

## 2018-09-16 NOTE — Transfer of Care (Signed)
Immediate Anesthesia Transfer of Care Note  Patient: Marie Wong  Procedure(s) Performed: OPEN TREATMENT RIGHT FOOT FRACTURE DISLOCATION (Right Foot)  Patient Location: PACU  Anesthesia Type:GA combined with regional for post-op pain  Level of Consciousness: awake, alert , oriented and patient cooperative  Airway & Oxygen Therapy: Patient Spontanous Breathing and Patient connected to nasal cannula oxygen  Post-op Assessment: Report given to RN, Post -op Vital signs reviewed and stable and Patient moving all extremities  Post vital signs: Reviewed and stable  Last Vitals:  Vitals Value Taken Time  BP 125/66 09/16/2018  2:17 PM  Temp    Pulse 100 09/16/2018  2:19 PM  Resp 18 09/16/2018  2:19 PM  SpO2 100 % 09/16/2018  2:19 PM  Vitals shown include unvalidated device data.  Last Pain:  Vitals:   09/16/18 0857  TempSrc: Oral  PainSc: 10-Worst pain ever      Patients Stated Pain Goal: 5 (60/10/93 2355)  Complications: No apparent anesthesia complications

## 2018-09-16 NOTE — Anesthesia Procedure Notes (Signed)
Anesthesia Regional Block: Popliteal block   Pre-Anesthetic Checklist: ,, timeout performed, Correct Patient, Correct Site, Correct Laterality, Correct Procedure, Correct Position, site marked, Risks and benefits discussed,  Surgical consent,  Pre-op evaluation,  At surgeon's request and post-op pain management  Laterality: Right  Prep: chloraprep       Needles:  Injection technique: Single-shot  Needle Type: Echogenic Stimulator Needle     Needle Length: 9cm  Needle Gauge: 21     Additional Needles:   Procedures:,,,, ultrasound used (permanent image in chart),,,,  Narrative:  Start time: 09/16/2018 9:49 AM End time: 09/16/2018 9:55 AM Injection made incrementally with aspirations every 5 mL.  Performed by: Personally  Anesthesiologist: Lidia Collum, MD  Additional Notes: Monitors applied. Injection made in 5cc increments. No resistance to injection. Good needle visualization. Patient tolerated procedure well.

## 2018-09-18 ENCOUNTER — Encounter (HOSPITAL_BASED_OUTPATIENT_CLINIC_OR_DEPARTMENT_OTHER): Payer: Self-pay | Admitting: Orthopaedic Surgery
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
## 2018-09-25 ENCOUNTER — Encounter: Payer: Self-pay | Admitting: *Deleted

## 2018-09-25 ENCOUNTER — Telehealth: Payer: Self-pay | Admitting: Neurology

## 2018-09-25 NOTE — Telephone Encounter (Signed)
Pt gave consent for VV on the phone/ Pt understands that although there may be some limitations with this type of visit, we will take all precautions to reduce any security or privacy concerns.  Pt understands that this will be treated like an in office visit and we will file with pt's insurance, and there may be a patient responsible charge related to this service. °

## 2018-09-29 ENCOUNTER — Encounter: Payer: Self-pay | Admitting: Neurology

## 2018-09-29 ENCOUNTER — Telehealth: Payer: BC Managed Care – PPO | Admitting: Neurology

## 2018-09-29 NOTE — Telephone Encounter (Signed)
Called pt & LVM asking for call back to update chart before mychart video visit at 2:30 pm. Also advised pt in message that if she is using a cellular device for VV to download the mychart app for her visit. Left office number in message.

## 2018-09-29 NOTE — Progress Notes (Unsigned)
Patient coming in to the office.

## 2018-09-29 NOTE — Telephone Encounter (Signed)
Pt returned my call. She confirmed name & DOB and provided updates to her chart. She has already tested her camera & microphone on the computer and understands when to check-in for her mychart VV. She verbalized appreciation.

## 2019-01-20 ENCOUNTER — Ambulatory Visit (INDEPENDENT_AMBULATORY_CARE_PROVIDER_SITE_OTHER): Payer: BC Managed Care – PPO | Admitting: Neurology

## 2019-01-20 ENCOUNTER — Other Ambulatory Visit: Payer: Self-pay

## 2019-01-20 ENCOUNTER — Encounter: Payer: Self-pay | Admitting: Neurology

## 2019-01-20 VITALS — BP 154/99 | HR 85 | Temp 98.2°F | Ht 63.0 in | Wt 251.0 lb

## 2019-01-20 DIAGNOSIS — F0781 Postconcussional syndrome: Secondary | ICD-10-CM | POA: Insufficient documentation

## 2019-01-20 MED ORDER — DONEPEZIL HCL 5 MG PO TABS: 5.0000 mg | ORAL_TABLET | Freq: Every day | ORAL | 3 refills | Status: DC
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
## 2019-01-20 NOTE — Patient Instructions (Addendum)
Concussion clinic Try to take the Amitriptyline at 1/2 tablet every night Consider Aricept    Post-Concussion Syndrome  A concussion is a brain injury from a direct hit (blow) to your head or body. This blow causes your brain to shake quickly back and forth inside your skull. This can damage brain cells and cause chemical changes in your brain. Concussions are usually not life-threatening but can cause several serious symptoms. Post-concussion syndrome is when symptoms that occur after a concussion last longer than normal. These symptoms can last from weeks to months. What are the causes? The cause of this condition is not known. It can happen whether your head injury was mild or severe. What increases the risk? You are more likely to develop this condition if:  You are female.  You are a child, teen, or young adult.  You had a past head injury.  You have a history of headaches.  You have depression or anxiety. What are the signs or symptoms? Physical symptoms  Headaches.  Tiredness.  Dizziness.  Weakness.  Blurry vision.  Sensitivity to light.  Hearing difficulties. Mental and emotional symptoms  Memory difficulties.  Difficulty with concentration.  Difficulty sleeping or staying asleep.  Feeling irritable.  Anxiety or depression.  Difficulty learning new things. How is this diagnosed? This condition may be diagnosed based on:  Your symptoms.  A description of your injury.  Your medical history. Your health care provider may order other tests such as:  Brain function tests (neurological testing).  CT scan. How is this treated? Treatment for this condition may depend on your symptoms. Symptoms usually go away on their own over time. Treatments may include:  Medicines for headaches.  Resting your brain and body for a few days after your injury.  Rehabilitation therapy, such as: ? Physical or occupational therapy. This may include exercises to  help with balance and dizziness. ? Mental health counseling. ? Speech therapy. ? Vision therapy. A brain and eye specialist can recommend treatments for vision problems. Follow these instructions at home: Medicines  Take over-the-counter and prescription medicines only as told by your health care provider.  Avoid opioid prescription pain medicines when recovering from a concussion. Activity  Limit your mental activities for the first few days after your injury, such as: ? Homework or job-related work. ? Complex thinking. ? Watching TV, and using a computer or phone. ? Playing memory games and puzzles. ? Gradually return to your normal activity level. If a certain activity brings on your symptoms, stop or slow down until you can do the activity without it triggering your symptoms.  Limit physical activity, such as exercise or sports, for the first few days after a concussion. Gradually return to normal activity as told by your health care provider. ? If a certain activity brings on your symptoms, stop or slow down until you can do the activity without it triggering your symptoms.  Rest. Rest helps your brain heal. Make sure you: ? Get plenty of sleep at night. Most adults should get at least 7-9 hours of sleep each night. ? Rest during the day. Take naps or rest breaks when you feel tired.  Do not do high-risk activities that could cause a second concussion, such as riding a bike or playing sports. Having another concussion before the first one has healed can be dangerous. General instructions  Do not drink alcohol until your health care provider says you can.  Keep track of the frequency and the severity of  your symptoms. Give this information to your health care provider.  Keep all follow-up visits as directed by your health care provider. This is important. Contact a health care provider if:  Your symptoms do not improve.  You have another injury. Get help right away if you:   Have a severe or worsening headache.  Are confused.  Have trouble staying awake.  Pass out.  Vomit.  Have weakness or numbness in any part of your body.  Have a seizure.  Have trouble speaking. Summary  Post-concussion syndrome is when symptoms that occur after a concussion last longer than normal.  Symptoms usually go away on their own over time. Depending on your symptoms, you may need treatment, such as medicines or rehabilitation therapy.  Rest your brain and body for a few days after your injury. Gradually return to activities, as told by your health care provider.  Get plenty of sleep, and avoid alcohol and opioid pain medicines while recovering from a concussion. This information is not intended to replace advice given to you by your health care provider. Make sure you discuss any questions you have with your health care provider. Document Released: 09/15/2001 Document Revised: 01/16/2018 Document Reviewed: 04/30/2017 Elsevier Patient Education  [place].  Donepezil tablets What is this medicine? DONEPEZIL (doe NEP e zil) is used to treat mild to moderate dementia caused by Alzheimer's disease. This medicine may be used for other purposes; ask your health care provider or pharmacist if you have questions. COMMON BRAND NAME(S): Aricept What should I tell my health care provider before I take this medicine? They need to know if you have any of these conditions:  asthma or other lung disease  difficulty passing urine  head injury  heart disease  history of irregular heartbeat  liver disease  seizures (convulsions)  stomach or intestinal disease, ulcers or stomach bleeding  an unusual or allergic reaction to donepezil, other medicines, foods, dyes, or preservatives  pregnant or trying to get pregnant  breast-feeding How should I use this medicine? Take this medicine by mouth with a glass of water. Follow the directions on the prescription  label. You may take this medicine with or without food. Take this medicine at regular intervals. This medicine is usually taken before bedtime. Do not take it more often than directed. Continue to take your medicine even if you feel better. Do not stop taking except on your doctor's advice. If you are taking the 23 mg donepezil tablet, swallow it whole; do not cut, crush, or chew it. Talk to your pediatrician regarding the use of this medicine in children. Special care may be needed. Overdosage: If you think you have taken too much of this medicine contact a poison control center or emergency room at once. NOTE: This medicine is only for you. Do not share this medicine with others. What if I miss a dose? If you miss a dose, take it as soon as you can. If it is almost time for your next dose, take only that dose, do not take double or extra doses. What may interact with this medicine? Do not take this medicine with any of the following medications:  certain medicines for fungal infections like itraconazole, fluconazole, posaconazole, and voriconazole  cisapride  dextromethorphan; quinidine  dronedarone  pimozide  quinidine  thioridazine This medicine may also interact with the following medications:  antihistamines for allergy, cough and cold  atropine  bethanechol  carbamazepine  certain medicines for bladder problems like oxybutynin, tolterodine  certain medicines for Parkinson's disease like benztropine, trihexyphenidyl  certain medicines for stomach problems like dicyclomine, hyoscyamine  certain medicines for travel sickness like scopolamine  dexamethasone  dofetilide  ipratropium  NSAIDs, medicines for pain and inflammation, like ibuprofen or naproxen  other medicines for Alzheimer's disease  other medicines that prolong the QT interval (cause an abnormal heart rhythm)  phenobarbital  phenytoin  rifampin, rifabutin or rifapentine  ziprasidone This list  may not describe all possible interactions. Give your health care provider a list of all the medicines, herbs, non-prescription drugs, or dietary supplements you use. Also tell them if you smoke, drink alcohol, or use illegal drugs. Some items may interact with your medicine. What should I watch for while using this medicine? Visit your doctor or health care professional for regular checks on your progress. Check with your doctor or health care professional if your symptoms do not get better or if they get worse. You may get drowsy or dizzy. Do not drive, use machinery, or do anything that needs mental alertness until you know how this drug affects you. What side effects may I notice from receiving this medicine? Side effects that you should report to your doctor or health care professional as soon as possible:  allergic reactions like skin rash, itching or hives, swelling of the face, lips, or tongue  feeling faint or lightheaded, falls  loss of bladder control  seizures  signs and symptoms of a dangerous change in heartbeat or heart rhythm like chest pain; dizziness; fast or irregular heartbeat; palpitations; feeling faint or lightheaded, falls; breathing problems  signs and symptoms of infection like fever or chills; cough; sore throat; pain or trouble passing urine  signs and symptoms of liver injury like dark yellow or brown urine; general ill feeling or flu-like symptoms; light-colored stools; loss of appetite; nausea; right upper belly pain; unusually weak or tired; yellowing of the eyes or skin  slow heartbeat or palpitations  unusual bleeding or bruising  vomiting Side effects that usually do not require medical attention (report to your doctor or health care professional if they continue or are bothersome):  diarrhea, especially when starting treatment  headache  loss of appetite  muscle cramps  nausea  stomach upset This list may not describe all possible side  effects. Call your doctor for medical advice about side effects. You may report side effects to FDA at 1-800-FDA-1088. Where should I keep my medicine? Keep out of reach of children. Store at room temperature between 15 and 30 degrees C (59 and 86 degrees F). Throw away any unused medicine after the expiration date. NOTE: This sheet is a summary. It may not cover all possible information. If you have questions about this medicine, talk to your doctor, pharmacist, or health care provider.  2020 Elsevier/Gold Standard (2018-03-17 10:33:41)

## 2019-03-02 ENCOUNTER — Other Ambulatory Visit: Payer: Self-pay | Admitting: Orthopaedic Surgery

## 2019-03-03 ENCOUNTER — Encounter (HOSPITAL_BASED_OUTPATIENT_CLINIC_OR_DEPARTMENT_OTHER): Payer: Self-pay | Admitting: *Deleted

## 2019-03-03 ENCOUNTER — Other Ambulatory Visit: Payer: Self-pay

## 2019-03-06 ENCOUNTER — Other Ambulatory Visit (HOSPITAL_COMMUNITY): Payer: BC Managed Care – PPO

## 2019-03-07 ENCOUNTER — Other Ambulatory Visit (HOSPITAL_COMMUNITY)
Admission: RE | Admit: 2019-03-07 | Discharge: 2019-03-07 | Disposition: A | Discharge status: Home / Self Care | Payer: BC Managed Care – PPO | Source: Ambulatory Visit | Attending: Orthopaedic Surgery | Admitting: Orthopaedic Surgery

## 2019-03-07 DIAGNOSIS — Z01812 Encounter for preprocedural laboratory examination: Secondary | ICD-10-CM | POA: Insufficient documentation

## 2019-03-07 DIAGNOSIS — Z20828 Contact with and (suspected) exposure to other viral communicable diseases: Secondary | ICD-10-CM | POA: Insufficient documentation

## 2019-03-07 LAB — SARS CORONAVIRUS 2 (TAT 6-24 HRS): SARS Coronavirus 2: NEGATIVE

## 2019-03-09 NOTE — Anesthesia Preprocedure Evaluation (Addendum)
Anesthesia Evaluation  Patient identified by MRN, date of birth, ID band Patient awake    Reviewed: Allergy & Precautions, NPO status , Patient's Chart, lab work & pertinent test results  Airway Mallampati: II  TM Distance: >3 FB Neck ROM: Full    Dental no notable dental hx. (+) Teeth Intact   Pulmonary sleep apnea and Continuous Positive Airway Pressure Ventilation ,    Pulmonary exam normal breath sounds clear to auscultation       Cardiovascular negative cardio ROS Normal cardiovascular exam Rhythm:Regular Rate:Normal     Neuro/Psych negative neurological ROS     GI/Hepatic negative GI ROS, Neg liver ROS,   Endo/Other  negative endocrine ROS  Renal/GU negative Renal ROS     Musculoskeletal  (+) Arthritis ,   Abdominal (+) + obese,   Peds  Hematology   Anesthesia Other Findings   Reproductive/Obstetrics                            Anesthesia Physical Anesthesia Plan  ASA: III  Anesthesia Plan: General   Post-op Pain Management:  Regional for Post-op pain   Induction: Inhalational  PONV Risk Score and Plan: 3 and Ondansetron, Dexamethasone and Treatment may vary due to age or medical condition  Airway Management Planned: LMA  Additional Equipment: None  Intra-op Plan:   Post-operative Plan:   Informed Consent: I have reviewed the patients History and Physical, chart, labs and discussed the procedure including the risks, benefits and alternatives for the proposed anesthesia with the patient or authorized representative who has indicated his/her understanding and acceptance.     Dental advisory given  Plan Discussed with: CRNA  Anesthesia Plan Comments: (LMA w R popliteal)      Anesthesia Quick Evaluation

## 2019-03-10 ENCOUNTER — Ambulatory Visit (HOSPITAL_BASED_OUTPATIENT_CLINIC_OR_DEPARTMENT_OTHER): Payer: BC Managed Care – PPO | Admitting: Anesthesiology

## 2019-03-10 ENCOUNTER — Other Ambulatory Visit: Payer: Self-pay

## 2019-03-10 ENCOUNTER — Encounter (HOSPITAL_BASED_OUTPATIENT_CLINIC_OR_DEPARTMENT_OTHER): Payer: Self-pay | Admitting: Anesthesiology

## 2019-03-10 ENCOUNTER — Ambulatory Visit (HOSPITAL_BASED_OUTPATIENT_CLINIC_OR_DEPARTMENT_OTHER)
Admission: RE | Admit: 2019-03-10 | Discharge: 2019-03-10 | Disposition: A | Discharge status: Home / Self Care | Payer: BC Managed Care – PPO | Attending: Orthopaedic Surgery | Admitting: Orthopaedic Surgery

## 2019-03-10 ENCOUNTER — Encounter (HOSPITAL_BASED_OUTPATIENT_CLINIC_OR_DEPARTMENT_OTHER)
Admission: RE | Disposition: A | Discharge status: Home / Self Care | Payer: Self-pay | Source: Home / Self Care | Attending: Orthopaedic Surgery

## 2019-03-10 DIAGNOSIS — Z7982 Long term (current) use of aspirin: Secondary | ICD-10-CM | POA: Insufficient documentation

## 2019-03-10 DIAGNOSIS — Z79899 Other long term (current) drug therapy: Secondary | ICD-10-CM | POA: Insufficient documentation

## 2019-03-10 DIAGNOSIS — T8489XA Other specified complication of internal orthopedic prosthetic devices, implants and grafts, initial encounter: Secondary | ICD-10-CM | POA: Diagnosis not present

## 2019-03-10 DIAGNOSIS — Y831 Surgical operation with implant of artificial internal device as the cause of abnormal reaction of the patient, or of later complication, without mention of misadventure at the time of the procedure: Secondary | ICD-10-CM | POA: Insufficient documentation

## 2019-03-10 DIAGNOSIS — F988 Other specified behavioral and emotional disorders with onset usually occurring in childhood and adolescence: Secondary | ICD-10-CM | POA: Diagnosis not present

## 2019-03-10 DIAGNOSIS — J45909 Unspecified asthma, uncomplicated: Secondary | ICD-10-CM | POA: Insufficient documentation

## 2019-03-10 DIAGNOSIS — G4733 Obstructive sleep apnea (adult) (pediatric): Secondary | ICD-10-CM | POA: Diagnosis not present

## 2019-03-10 DIAGNOSIS — G4730 Sleep apnea, unspecified: Secondary | ICD-10-CM | POA: Insufficient documentation

## 2019-03-10 HISTORY — PX: HARDWARE REMOVAL: SHX979

## 2019-03-10 SURGERY — REMOVAL, HARDWARE
Anesthesia: General | Site: Foot | Laterality: Right

## 2019-03-10 MED ORDER — POVIDONE-IODINE 10 % EX SWAB: 2.0000 "application " | Freq: Once | CUTANEOUS | Status: DC

## 2019-03-10 MED ORDER — LACTATED RINGERS IV SOLN
INTRAVENOUS | Status: DC
  Administered 2019-03-10: 10:00:00 via INTRAVENOUS

## 2019-03-10 MED ORDER — IBUPROFEN 200 MG PO TABS
ORAL_TABLET | ORAL | Status: AC
  Filled 2019-03-10: qty 2

## 2019-03-10 MED ORDER — ROPIVACAINE HCL 5 MG/ML IJ SOLN
INTRAMUSCULAR | Status: DC | PRN
  Administered 2019-03-10: 30 mL via PERINEURAL

## 2019-03-10 MED ORDER — MIDAZOLAM HCL 2 MG/2ML IJ SOLN
2.0000 mg | Freq: Once | INTRAMUSCULAR | Status: AC
  Administered 2019-03-10: 2 mg via INTRAVENOUS

## 2019-03-10 MED ORDER — FENTANYL CITRATE (PF) 100 MCG/2ML IJ SOLN
INTRAMUSCULAR | Status: DC | PRN
  Administered 2019-03-10: 50 ug via INTRAVENOUS
  Administered 2019-03-10 (×2): 25 ug via INTRAVENOUS

## 2019-03-10 MED ORDER — CEFAZOLIN SODIUM-DEXTROSE 2-4 GM/100ML-% IV SOLN
INTRAVENOUS | Status: AC
  Filled 2019-03-10: qty 100

## 2019-03-10 MED ORDER — FENTANYL CITRATE (PF) 100 MCG/2ML IJ SOLN
INTRAMUSCULAR | Status: AC
  Filled 2019-03-10: qty 2

## 2019-03-10 MED ORDER — PROPOFOL 10 MG/ML IV BOLUS
INTRAVENOUS | Status: AC
  Filled 2019-03-10: qty 20

## 2019-03-10 MED ORDER — LIDOCAINE 2% (20 MG/ML) 5 ML SYRINGE
INTRAMUSCULAR | Status: DC | PRN
  Administered 2019-03-10: 100 mg via INTRAVENOUS

## 2019-03-10 MED ORDER — CEFAZOLIN SODIUM-DEXTROSE 2-4 GM/100ML-% IV SOLN
2.0000 g | INTRAVENOUS | Status: AC
  Administered 2019-03-10: 2 g via INTRAVENOUS

## 2019-03-10 MED ORDER — ONDANSETRON HCL 4 MG/2ML IJ SOLN
INTRAMUSCULAR | Status: DC | PRN
  Administered 2019-03-10: 4 mg via INTRAVENOUS

## 2019-03-10 MED ORDER — PROPOFOL 10 MG/ML IV BOLUS
INTRAVENOUS | Status: DC | PRN
  Administered 2019-03-10: 200 mg via INTRAVENOUS

## 2019-03-10 MED ORDER — OXYCODONE HCL 5 MG PO TABS: 5.0000 mg | ORAL_TABLET | ORAL | 0 refills | Status: AC | PRN

## 2019-03-10 MED ORDER — DEXAMETHASONE SODIUM PHOSPHATE 10 MG/ML IJ SOLN
INTRAMUSCULAR | Status: DC | PRN
  Administered 2019-03-10: 5 mg via INTRAVENOUS

## 2019-03-10 MED ORDER — FENTANYL CITRATE (PF) 100 MCG/2ML IJ SOLN
50.0000 ug | Freq: Once | INTRAMUSCULAR | Status: AC
  Administered 2019-03-10: 50 ug via INTRAVENOUS

## 2019-03-10 MED ORDER — IBUPROFEN 400 MG PO TABS
400.0000 mg | ORAL_TABLET | Freq: Once | ORAL | Status: AC
  Administered 2019-03-10: 400 mg via ORAL

## 2019-03-10 MED ORDER — MIDAZOLAM HCL 2 MG/2ML IJ SOLN
INTRAMUSCULAR | Status: AC
  Filled 2019-03-10: qty 2

## 2019-03-10 SURGICAL SUPPLY — 62 items
BANDAGE ESMARK 6X9 LF (GAUZE/BANDAGES/DRESSINGS) IMPLANT
BENZOIN TINCTURE PRP APPL 2/3 (GAUZE/BANDAGES/DRESSINGS) IMPLANT
BLADE SURG 15 STRL LF DISP TIS (BLADE) ×3 IMPLANT
BLADE SURG 15 STRL SS (BLADE) ×3
BNDG ELASTIC 4X5.8 VLCR STR LF (GAUZE/BANDAGES/DRESSINGS) ×2 IMPLANT
BNDG ELASTIC 6X5.8 VLCR STR LF (GAUZE/BANDAGES/DRESSINGS) IMPLANT
BNDG ESMARK 4X9 LF (GAUZE/BANDAGES/DRESSINGS) IMPLANT
BNDG ESMARK 6X9 LF (GAUZE/BANDAGES/DRESSINGS)
CHLORAPREP W/TINT 26 (MISCELLANEOUS) ×2 IMPLANT
COVER BACK TABLE REUSABLE LG (DRAPES) ×2 IMPLANT
COVER WAND RF STERILE (DRAPES) IMPLANT
CUFF TOURN SGL QUICK 34 (TOURNIQUET CUFF) ×1
CUFF TRNQT CYL 34X4.125X (TOURNIQUET CUFF) ×1 IMPLANT
DECANTER SPIKE VIAL GLASS SM (MISCELLANEOUS) IMPLANT
DRAPE C-ARM 42X72 X-RAY (DRAPES) ×2 IMPLANT
DRAPE EXTREMITY T 121X128X90 (DISPOSABLE) ×2 IMPLANT
DRAPE HALF SHEET 70X43 (DRAPES) ×2 IMPLANT
DRAPE IMP U-DRAPE 54X76 (DRAPES) ×2 IMPLANT
DRAPE U-SHAPE 47X51 STRL (DRAPES) ×2 IMPLANT
DRSG PAD ABDOMINAL 8X10 ST (GAUZE/BANDAGES/DRESSINGS) ×2 IMPLANT
ELECT REM PT RETURN 9FT ADLT (ELECTROSURGICAL) ×2
ELECTRODE REM PT RTRN 9FT ADLT (ELECTROSURGICAL) ×1 IMPLANT
GAUZE SPONGE 4X4 12PLY STRL (GAUZE/BANDAGES/DRESSINGS) ×2 IMPLANT
GAUZE XEROFORM 1X8 LF (GAUZE/BANDAGES/DRESSINGS) ×2 IMPLANT
GLOVE BIOGEL M STRL SZ7.5 (GLOVE) IMPLANT
GLOVE BIOGEL PI IND STRL 7.0 (GLOVE) ×2 IMPLANT
GLOVE BIOGEL PI IND STRL 8 (GLOVE) ×1 IMPLANT
GLOVE BIOGEL PI INDICATOR 7.0 (GLOVE) ×2
GLOVE BIOGEL PI INDICATOR 8 (GLOVE) ×1
GLOVE SURG SS PI 6.5 STRL IVOR (GLOVE) ×4 IMPLANT
GLOVE SURG SYN 7.5  E (GLOVE) ×1
GLOVE SURG SYN 7.5 E (GLOVE) ×1 IMPLANT
GOWN STRL REUS W/ TWL LRG LVL3 (GOWN DISPOSABLE) ×2 IMPLANT
GOWN STRL REUS W/ TWL XL LVL3 (GOWN DISPOSABLE) ×1 IMPLANT
GOWN STRL REUS W/TWL LRG LVL3 (GOWN DISPOSABLE) ×2
GOWN STRL REUS W/TWL XL LVL3 (GOWN DISPOSABLE) ×1
NEEDLE HYPO 25X1 1.5 SAFETY (NEEDLE) IMPLANT
NS IRRIG 1000ML POUR BTL (IV SOLUTION) ×2 IMPLANT
PACK BASIN DAY SURGERY FS (CUSTOM PROCEDURE TRAY) ×2 IMPLANT
PAD CAST 4YDX4 CTTN HI CHSV (CAST SUPPLIES) ×1 IMPLANT
PADDING CAST COTTON 4X4 STRL (CAST SUPPLIES) ×1
PADDING CAST SYNTHETIC 4 (CAST SUPPLIES)
PADDING CAST SYNTHETIC 4X4 STR (CAST SUPPLIES) IMPLANT
PENCIL SMOKE EVACUATOR (MISCELLANEOUS) ×2 IMPLANT
SLEEVE SCD COMPRESS KNEE MED (MISCELLANEOUS) ×2 IMPLANT
SPLINT FAST PLASTER 5X30 (CAST SUPPLIES)
SPLINT PLASTER CAST FAST 5X30 (CAST SUPPLIES) IMPLANT
SPONGE LAP 18X18 RF (DISPOSABLE) ×2 IMPLANT
STOCKINETTE 6  STRL (DRAPES) ×1
STOCKINETTE 6 STRL (DRAPES) ×1 IMPLANT
STRIP CLOSURE SKIN 1/2X4 (GAUZE/BANDAGES/DRESSINGS) IMPLANT
SUCTION FRAZIER HANDLE 10FR (MISCELLANEOUS)
SUCTION TUBE FRAZIER 10FR DISP (MISCELLANEOUS) IMPLANT
SUT ETHILON 3 0 PS 1 (SUTURE) ×4 IMPLANT
SUT MNCRL AB 3-0 PS2 18 (SUTURE) ×4 IMPLANT
SUT PDS AB 2-0 CT2 27 (SUTURE) IMPLANT
SUT VIC AB 3-0 FS2 27 (SUTURE) IMPLANT
SYR BULB 3OZ (MISCELLANEOUS) ×2 IMPLANT
SYR CONTROL 10ML LL (SYRINGE) IMPLANT
TOWEL GREEN STERILE FF (TOWEL DISPOSABLE) ×4 IMPLANT
TUBE CONNECTING 20X1/4 (TUBING) IMPLANT
UNDERPAD 30X36 HEAVY ABSORB (UNDERPADS AND DIAPERS) ×2 IMPLANT

## 2019-03-10 NOTE — Progress Notes (Signed)
Assisted Dr. Houser with right, ultrasound guided, popliteal block. Side rails up, monitors on throughout procedure. See vital signs in flow sheet. Tolerated Procedure well. 

## 2019-03-10 NOTE — Anesthesia Procedure Notes (Signed)
Procedure Name: LMA Insertion Date/Time: 03/10/2019 12:05 PM Performed by: Eulas Post, Milanni Ayub W, CRNA Pre-anesthesia Checklist: Patient identified, Emergency Drugs available, Suction available and Patient being monitored Patient Re-evaluated:Patient Re-evaluated prior to induction Oxygen Delivery Method: Circle system utilized Preoxygenation: Pre-oxygenation with 100% oxygen Induction Type: IV induction Ventilation: Mask ventilation without difficulty LMA: LMA inserted LMA Size: 4.0 Number of attempts: 1 Placement Confirmation: positive ETCO2 and breath sounds checked- equal and bilateral Tube secured with: Tape Dental Injury: Teeth and Oropharynx as per pre-operative assessment

## 2019-03-10 NOTE — Transfer of Care (Signed)
Immediate Anesthesia Transfer of Care Note  Patient: Marie Wong  Procedure(s) Performed: HARDWARE REMOVAL DEEP RIGHT FOOT (Right Foot)  Patient Location: PACU  Anesthesia Type:General  Level of Consciousness: awake  Airway & Oxygen Therapy: Patient Spontanous Breathing and Patient connected to face mask oxygen  Post-op Assessment: Report given to RN and Post -op Vital signs reviewed and stable  Post vital signs: Reviewed and stable  Last Vitals:  Vitals Value Taken Time  BP 124/72 03/10/19 1316  Temp    Pulse 73 03/10/19 1318  Resp 9 03/10/19 1318  SpO2 100 % 03/10/19 1318  Vitals shown include unvalidated device data.  Last Pain:  Vitals:   03/10/19 1025  TempSrc: Oral  PainSc: 2       Patients Stated Pain Goal: 5 ([id number])  Complications: No apparent anesthesia complications

## 2019-03-10 NOTE — Discharge Instructions (Signed)
DR. Lucia Gaskins FOOT [facility] ANKLE SURGERY POST-OP INSTRUCTIONS   Pain Management 1. The numbing medicine and your leg will last around 18 hours, take a dose of your pain medicine as soon as you feel it wearing off to avoid rebound pain. 2. Keep your foot elevated above heart level.  Make sure that your heel hangs free ('floats'). 3. Take all prescribed medication as directed. 4. If taking narcotic pain medication you may want to use an over-the-counter stool softener to avoid constipation. 5. You may take over-the-counter NSAIDs (ibuprofen, naproxen, etc.) as well as over-the-counter acetaminophen as directed on the packaging as a supplement for your pain and may also use it to wean away from the prescription medication.  Activity ? Weightbearing in post operative boot. ? Keep dressing in place  First Postoperative Visit 1. Your first postop visit will be at least 2 weeks after surgery.  This should be scheduled when you schedule surgery. 2. If you do not have a postoperative visit scheduled please call [phone] to schedule an appointment. 3. At the appointment your incision will be evaluated for suture removal, x-rays will be obtained if necessary.  General Instructions 1. Swelling is very common after foot and ankle surgery.  It often takes 3 months for the foot and ankle to begin to feel comfortable.  Some amount of swelling will persist for 6-12 months. 2. DO NOT change the dressing.  If there is a problem with the dressing (too tight, loose, gets wet, etc.) please contact Dr. Pollie Friar office. 3. DO NOT get the dressing wet.  For showers you can use an over-the-counter cast cover or wrap a washcloth around the top of your dressing and then cover it with a plastic bag and tape it to your leg. 4. DO NOT soak the incision (no tubs, pools, bath, etc.) until you have approval from Dr. Lucia Gaskins.  Contact Dr. Huel Cote office or go to Emergency Room if: 1. Temperature above 101 F. 2. Increasing pain that  is unresponsive to pain medication or elevation 3. Excessive redness or swelling in your foot 4. Dressing problems - excessive bloody drainage, looseness or tightness, or if dressing gets wet 5. Develop pain, swelling, warmth, or discoloration of your calf    Post Anesthesia Home Care Instructions  Activity: Get plenty of rest for the remainder of the day. A responsible individual must stay with you for 24 hours following the procedure.  For the next 24 hours, DO NOT: -Drive a car -Paediatric nurse -Drink alcoholic beverages -Take any medication unless instructed by your physician -Make any legal decisions or sign important papers.  Meals: Start with liquid foods such as gelatin or soup. Progress to regular foods as tolerated. Avoid greasy, spicy, heavy foods. If nausea and/or vomiting occur, drink only clear liquids until the nausea and/or vomiting subsides. Call your physician if vomiting continues.  Special Instructions/Symptoms: Your throat may feel dry or sore from the anesthesia or the breathing tube placed in your throat during surgery. If this causes discomfort, gargle with warm salt water. The discomfort should disappear within 24 hours.  If you had a scopolamine patch placed behind your ear for the management of post- operative nausea and/or vomiting:  1. The medication in the patch is effective for 72 hours, after which it should be removed.  Wrap patch in a tissue and discard in the trash. Wash hands thoroughly with soap and water. 2. You may remove the patch earlier than 72 hours if you experience unpleasant side effects  which may include dry mouth, dizziness or visual disturbances. 3. Avoid touching the patch. Wash your hands with soap and water after contact with the patch.    Call your surgeon if you experience:   1.  Fever over 101.0. 2.  Inability to urinate. 3.  Nausea and/or vomiting. 4.  Extreme swelling or bruising at the surgical site. 5.  Continued  bleeding from the incision. 6.  Increased pain, redness or drainage from the incision. 7.  Problems related to your pain medication. 8.  Any problems and/or concerns

## 2019-03-10 NOTE — Anesthesia Procedure Notes (Signed)
Anesthesia Regional Block: Popliteal block   Pre-Anesthetic Checklist: ,, timeout performed, Correct Patient, Correct Site, Correct Laterality, Correct Procedure, Correct Position, site marked, Risks and benefits discussed, pre-op evaluation,  At surgeon's request and post-op pain management  Laterality: Right  Prep: Maximum Sterile Barrier Precautions used, chloraprep       Needles:  Injection technique: Single-shot  Needle Type: Echogenic Needle     Needle Length: 9cm  Needle Gauge: 21     Additional Needles:   Procedures:,,,, ultrasound used (permanent image in chart),,,,  Narrative:  Start time: 03/10/2019 10:54 AM End time: 03/10/2019 10:59 AM Injection made incrementally with aspirations every 5 mL.  Performed by: Personally  Anesthesiologist: Barnet Glasgow, MD  Additional Notes: Block assessed. Patient tolerated procedure well.

## 2019-03-10 NOTE — Anesthesia Postprocedure Evaluation (Signed)
Anesthesia Post Note  Patient: Marie Wong  Procedure(s) Performed: HARDWARE REMOVAL DEEP RIGHT FOOT (Right Foot)     Patient location during evaluation: PACU Anesthesia Type: General Level of consciousness: awake and alert Pain management: pain level controlled Vital Signs Assessment: post-procedure vital signs reviewed and stable Respiratory status: spontaneous breathing, nonlabored ventilation, respiratory function stable and patient connected to nasal cannula oxygen Cardiovascular status: blood pressure returned to baseline and stable Postop Assessment: no apparent nausea or vomiting Anesthetic complications: no    Last Vitals:  Vitals:   03/10/19 1415 03/10/19 1442  BP: 126/84 (!) 142/96  Pulse: 72 73  Resp: 12 16  Temp:  36.7 C  SpO2: 98% 100%    Last Pain:  Vitals:   03/10/19 1442  TempSrc: Oral  PainSc: 5                  Barnet Glasgow

## 2019-03-10 NOTE — H&P (Signed)
Marie Wong is an 43 y.o. female.   Chief Complaint: Symptomatic orthopedic hardware of right foot HPI: Patient underwent open treatment of right foot midfoot fracture dislocations on 09/16/2018.  She continues to have pain laterally at the site of the cuboid plate and dorsally along the site of the fractures.  There was concern for symptomatic hardware due to pain laterally about the plate and the peroneal tendons as well as pain dorsally about the foot.  It had been 5 months since her surgery and therefore safe to remove the hardware.  She has been wearing regular shoes now for over a month.  Past Medical History:  Diagnosis Date  . Abnormal liver function   . ADD (attention deficit disorder)   . Allergic rhinitis   . Ankle fracture, right 08/2018  . Asthma    induced by seasonal allergies (PRN albuterol)  . Degenerative lumbar disc   . Dysmenorrhea   . Dyssomnia   . Eczema 08/2018   on head  . Enlarged lymph nodes    in chest  . Fracture of body of sternum, initial encounter for closed fracture 08/2018  . History of kidney stones   . OSA on CPAP   . Ovarian cyst   . Sarcoidosis   . Short-term memory loss   . Sleep apnea   . Vitamin D deficiency     Past Surgical History:  Procedure Laterality Date  . kidney stones  2018  . OPEN REDUCTION INTERNAL FIXATION (ORIF) FOOT LISFRANC FRACTURE Right 09/16/2018   Procedure: OPEN TREATMENT RIGHT FOOT FRACTURE DISLOCATION;  Surgeon: Marie Crocker, MD;  Location: [facility];  Service: Orthopedics;  Laterality: Right;  LENGHT: 240 MINS  . UPPER GI ENDOSCOPY     and ultrasonic biopsy of lymph nodes in chest.     Family History  Problem Relation Age of Onset  . Other Father        cholesterol  . Diabetes Father   . Hypertension Father    Social History:  reports that she has never smoked. She has never used smokeless tobacco. She reports that she does not drink alcohol or use drugs.  Allergies:  Allergies   Allergen Reactions  . Latex     Hives, rash (no throat swelling)    Medications Prior to Admission  Medication Sig Dispense Refill  . Acetaminophen (TYLENOL PO) Take by mouth as needed.    Marland Kitchen amitriptyline (ELAVIL) 50 MG tablet Take 50 mg by mouth at bedtime as needed.    Marland Kitchen amphetamine-dextroamphetamine (ADDERALL) 15 MG tablet Take 15 mg by mouth daily.    Marland Kitchen aspirin 81 MG chewable tablet Chew 243 mg by mouth daily.    Marland Kitchen donepezil (ARICEPT) 5 MG tablet Take 1 tablet (5 mg total) by mouth daily. 30 tablet 3  . escitalopram (LEXAPRO) 10 MG tablet Take 10 mg by mouth daily.    . IBUPROFEN PO Take by mouth as needed.    . Omega-3 Fatty Acids (FISH OIL PO) Take by mouth.    . zolpidem (AMBIEN) 10 MG tablet Take 10 mg by mouth at bedtime as needed for sleep.    Marland Kitchen albuterol (VENTOLIN HFA) 108 (90 Base) MCG/ACT inhaler Inhale 2 puffs into the lungs every 6 (six) hours as needed for wheezing or shortness of breath.      No results found for this or any previous visit (from the past 48 hour(s)). No results found.  Review of Systems  Constitutional: Negative.  HENT: Negative.   Eyes: Negative.   Respiratory: Negative.   Cardiovascular: Negative.   Gastrointestinal: Negative.   Musculoskeletal:       Right foot pain  Skin: Negative.   Neurological: Negative.   Psychiatric/Behavioral: Negative.     Blood pressure 130/78, pulse 66, temperature (!) 97.3 F (36.3 C), temperature source Oral, resp. rate 16, height 5\' 3"  (1.6 m), weight 113 kg, last menstrual period 02/24/2019, SpO2 100 %. Physical Exam  Constitutional: She appears well-developed.  HENT:  Head: Normocephalic.  Eyes: Conjunctivae are normal.  Neck: Neck supple.  Cardiovascular: Normal rate.  Respiratory: Effort normal.  GI: Soft.  Musculoskeletal:     Comments: Right foot is swollen.  There is tenderness to palpation over the site of hardware laterally and dorsally about the foot.  She has absent sensation in the deep  peroneal nerve distribution distally.  She is able to actively dorsiflex and plantarflex the ankle.  No erythema.  Pain with resisted active eversion of the hindfoot.  Neurological: She is alert.  Skin: Skin is warm.  Psychiatric: She has a normal mood and affect.     Assessment/Plan We will proceed with planned hardware removal of her right foot.  This will include the cuboid plate.  We will also inspect her peroneal tendons at the site of the cuboid plate to ensure no tendinosis and debride them if necessary.  She understands the risk benefits alternatives to surgery which include but are not limited to wound healing complications, infection, need for further surgery, continued pain and damage to surrounding structures.  She also understands the perioperative and anesthetic risk which include death.  She wishes to proceed with surgery.  02/26/2019, MD 03/10/2019, 11:34 AM

## 2019-03-11 ENCOUNTER — Encounter (HOSPITAL_BASED_OUTPATIENT_CLINIC_OR_DEPARTMENT_OTHER): Payer: Self-pay | Admitting: Orthopaedic Surgery

## 2019-03-11 NOTE — Op Note (Signed)
Marie Wong female 43 y.o. 03/10/2019  PreOperative Diagnosis: Symptomatic orthopedic hardware, right foot  PostOperative Diagnosis: Same  PROCEDURE: Hardware removal deep from dorsal medial midfoot Hardware removal deep from medial foot Hardware removal deep from cuboid  SURGEON: Melony Overly, MD  ASSISTANT: None  ANESTHESIA: General LMA  FINDINGS: Retained deep orthopedic hardware about the first, second and third TMT joints Retained deep orthopedic hardware from Lisfranc joint Retained deep orthopedic hardware of cuboid  IMPLANTS: None  INDICATIONS:43 y.o. female underwent open treatment of several midfoot fracture dislocations about 5 months ago.  She healed these uneventfully but had continued pain laterally and dorsally and medially about the foot at the site of her hardware.  These were joint spanning implants and plan was for hardware removal eventually.  Given the healing on x-ray and her ability to ambulate in regular shoes but due to the continued discomfort and risk of hardware breakage she was indicated for hardware removal of all implants.  She was having some discomfort laterally at the site of the cuboid plate from likely peroneal tendon irritation about the hardware as well.  She understood the risk benefits alternatives surgery which include but not limited to wound healing complications, infection, need for further surgery and continued pain.  After weighing these risks he wished to proceed with surgery.  PROCEDURE: Patient was identified in the preoperative holding area.  The right foot was marked myself.  Consent was signed myself and the patient.  Anesthesia performed a peripheral nerve blockade.  She was taken the operative suite placed supine the operative table.  Tourniquet was placed on the right thigh.  A bump was placed under the right hip after LMA anesthesia was induced without difficulty.  Preoperative antibiotics were given.  Bone foam was used.   Right lower extremity was prepped and draped in usual sterile fashion a timeout was performed.  We began by making a longitudinal incision overlying her previous dorsal incision.  This taken sharply down through skin and subcutaneous tissue.  There is a large scar formation.  The extensor hallucis brevis muscle belly and tendon was encased in scar.  This was mobilized and retracted.  Then blunt dissection was used to identify the underlying plate.  Freer elevator and sharp dissection was used to elevate the soft tissue overlying the plate and the 4 screws were removed as well as the plate was removed.  This was done without difficulty.  Then further dissection was carried more laterally and the screw was found for the third TMT joint.  The cannulated wire was placed within the screw and it was backed out without difficulty.  A second incision was made along the medial aspect of the foot.  Blunt dissection was used to mobilize soft tissue down to the medial screw head that was crossing the Lisfranc joint.  Then the guidewire was placed through the cannulated screw.  The screwdriver was placed over the wire and the screw was backed out without difficulty.  A third incision was created laterally overlying the cuboid at the site of her previous surgery.  This was taken sharply down through skin and subcutaneous tissue.  Blunt dissection was used to identify the extensor digitorum brevis muscle belly.  This was elevated dorsally.  Bleeders were cauterized.  The plate was identified and blunt dissection was used to mobilize the soft tissue overlying the plate at the cuboid.  Each screw was removed and the plate was removed without difficulty.  After removal of all the hardware  fluoroscopy confirmed no remaining hardware was present.  Then the first TMT, second TMT and third TMT joints were stressed under direct visualization and found to be stable.  There is no obvious gross instability of the Lisfranc joint.  The  cuboid appeared well-healed.  The wounds were then irrigated copiously with normal saline and the tourniquet was released.  Hemostasis was obtained.  Then the deep tissue was closed with a 3-0 Monocryl stitch.  The skin was closed with a 3-0 nylon stitch.  This was done for all 3 incisions.  Then Xeroform, 4 x 4's, ABD pad and soft dressing were placed.  She was awakened from anesthesia and taken recovery in stable condition.  There were no complications.  She will be placed in a walking boot in PACU.  POST OPERATIVE INSTRUCTIONS: Weightbearing as tolerated in the walking boot. Keep dressing in place Call the office with concerns Reinforce dressing for any bleeding We will see her back in 2 weeks for dressing removal, suture removal if appropriate and weightbearing x-rays of the right foot on arrival.  TOURNIQUET TIME: 37 minutes  BLOOD LOSS:  Minimal         DRAINS: none         SPECIMEN: none       COMPLICATIONS:  * No complications entered in OR log *         Disposition: PACU - hemodynamically stable.         Condition: stable

## 2019-06-26 ENCOUNTER — Other Ambulatory Visit: Payer: Self-pay | Admitting: Neurology

## 2019-07-02 ENCOUNTER — Other Ambulatory Visit: Payer: Self-pay | Admitting: Neurology

## 2019-07-06 ENCOUNTER — Other Ambulatory Visit: Payer: Self-pay | Admitting: Neurology

## 2019-07-06 MED ORDER — DONEPEZIL HCL 5 MG PO TABS: 5.0000 mg | ORAL_TABLET | Freq: Every day | ORAL | 3 refills | Status: AC
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
## 2021-01-30 IMAGING — DX RIGHT FOOT - 2 VIEW
2 series · 2 of 2 positions shown · non-contrast
Comparison: Films from the previous day.

CLINICAL DATA: Status post reduction

EXAM:
RIGHT FOOT - 2 VIEW

[foot]
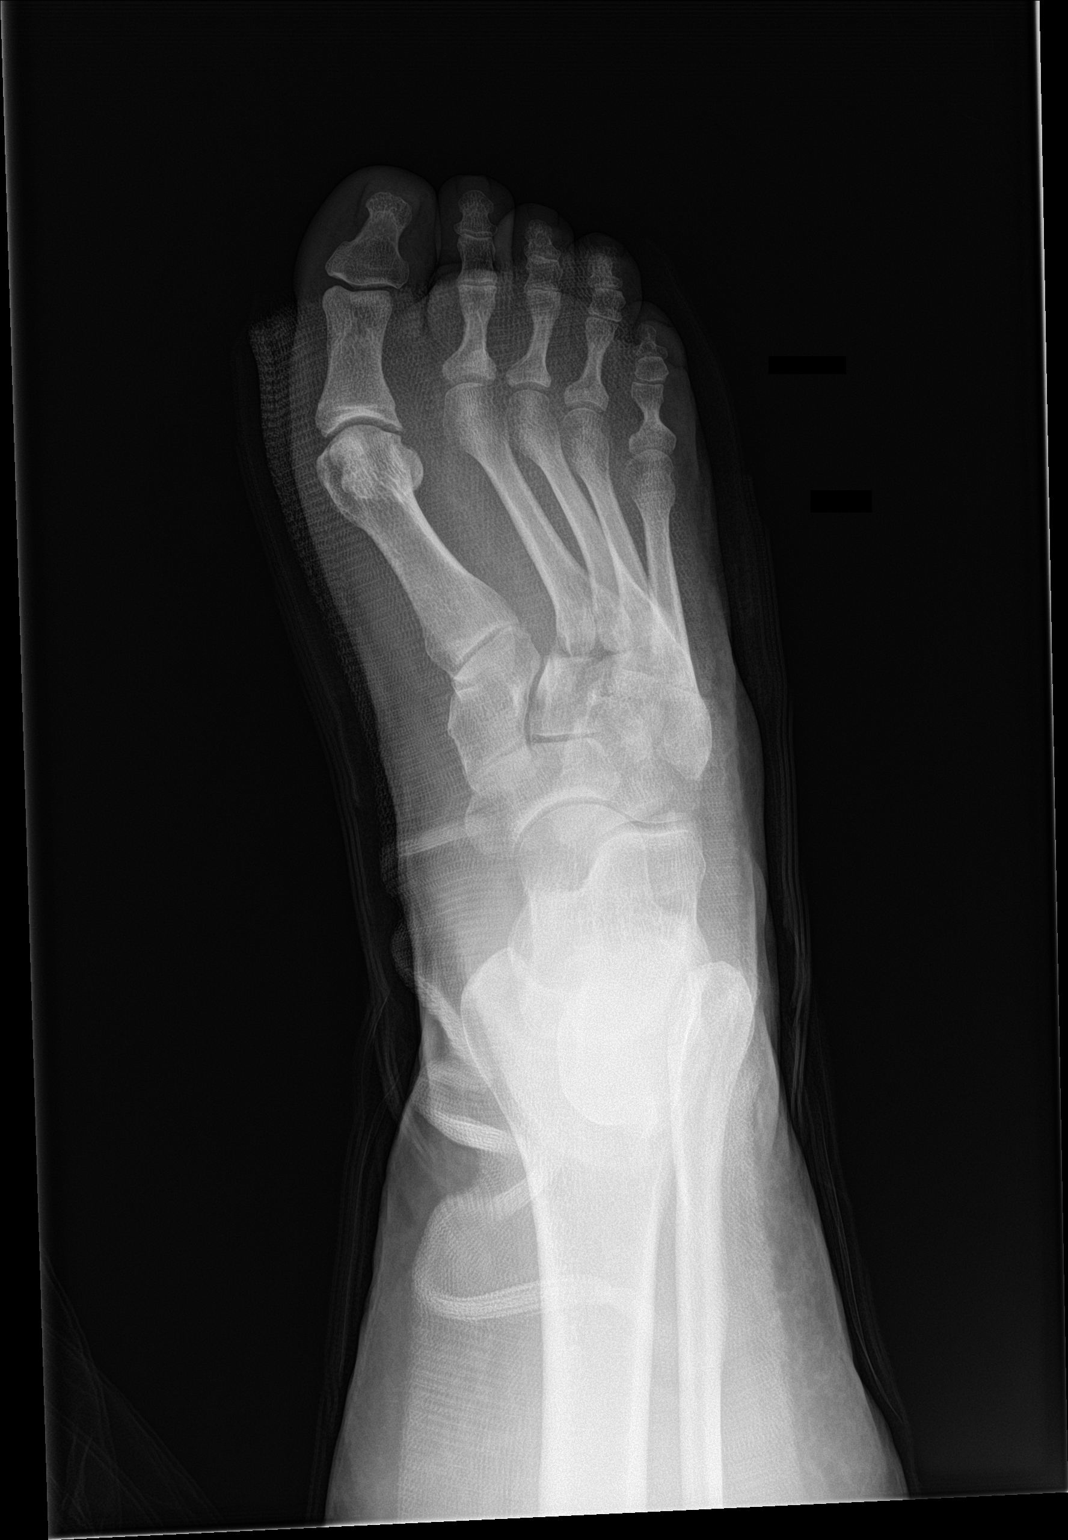

[leg]
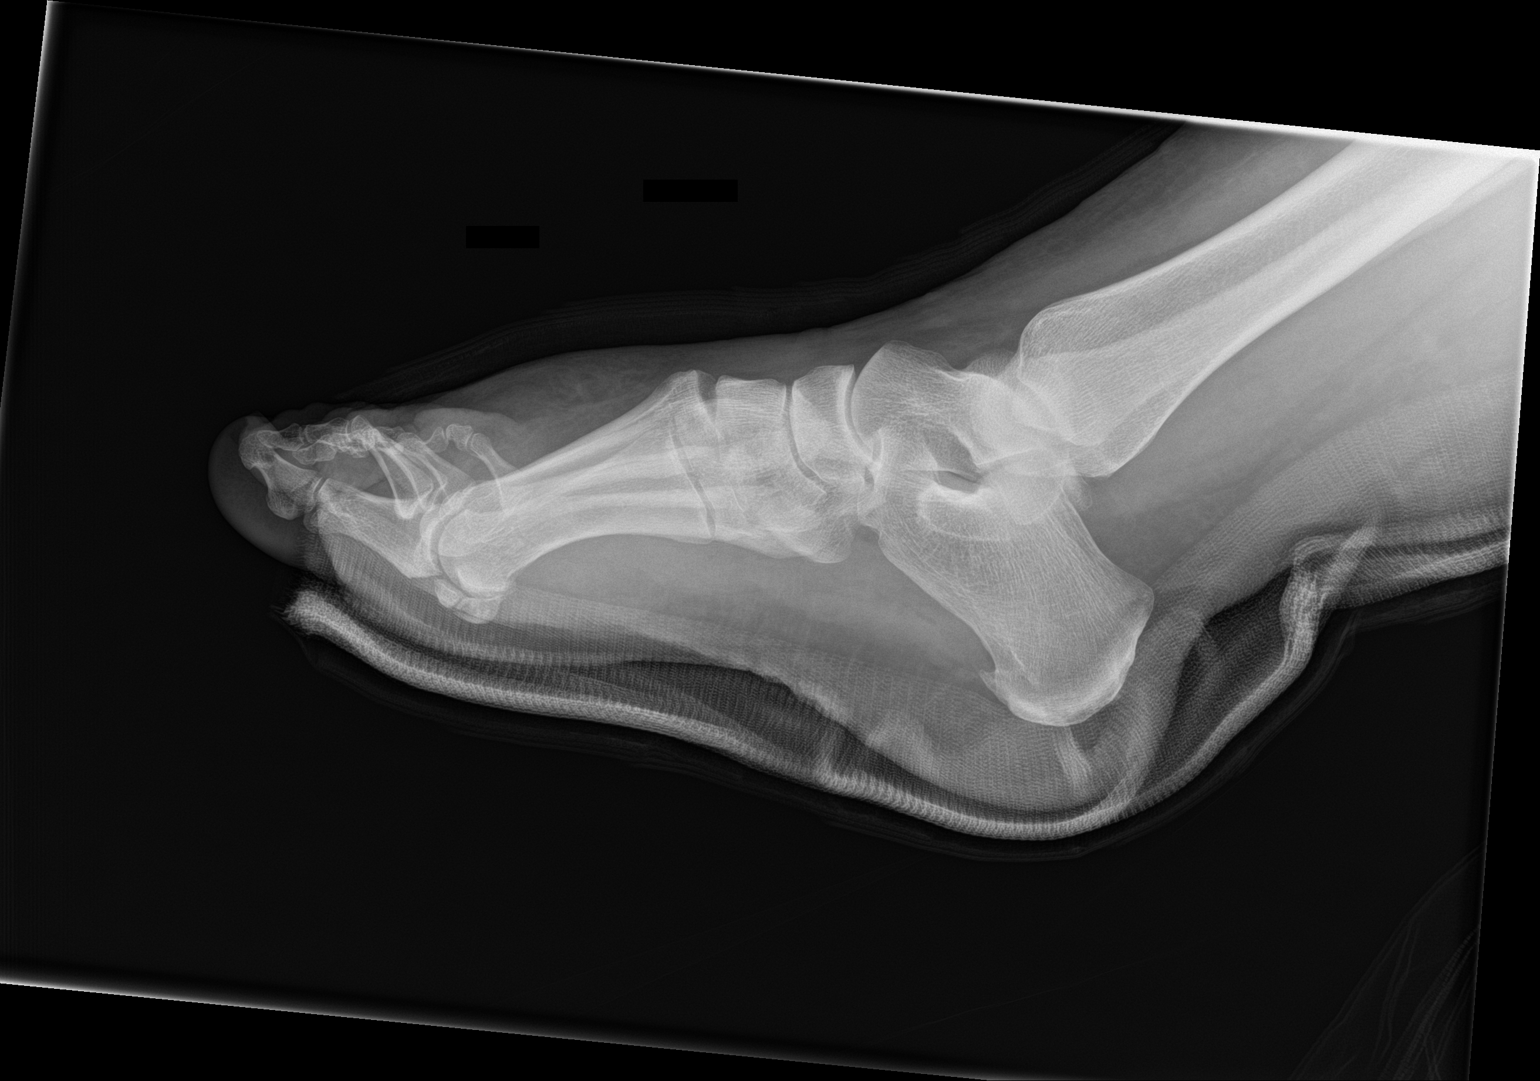

[2 of 2 positions shown; findings below may reference images not displayed]

FINDINGS: Splinting material is now seen. There has been interval relocation
of the first metatarsal with respect to the first cuneiform. The
remaining metatarsals remain incompletely evaluated due to
obliquity. There remains some widening of the space between the
first cuneiform and first metatarsal. Soft tissue changes are noted.
IMPRESSION: Interval reduction of the first metatarsal.
